# Patient Record
Sex: Female | Born: 1940 | Race: White | Hispanic: No | Marital: Married | Smoking: Never smoker
Health system: Southern US, Community
[De-identification: ages and names within clinical notes are randomized; demographics above are authoritative.]

## PROBLEM LIST (undated history)

## (undated) DIAGNOSIS — Z923 Personal history of irradiation: Secondary | ICD-10-CM

## (undated) DIAGNOSIS — C801 Malignant (primary) neoplasm, unspecified: Secondary | ICD-10-CM

## (undated) DIAGNOSIS — M810 Age-related osteoporosis without current pathological fracture: Secondary | ICD-10-CM

## (undated) DIAGNOSIS — K2270 Barrett's esophagus without dysplasia: Secondary | ICD-10-CM

## (undated) DIAGNOSIS — M1990 Unspecified osteoarthritis, unspecified site: Secondary | ICD-10-CM

## (undated) DIAGNOSIS — C50919 Malignant neoplasm of unspecified site of unspecified female breast: Secondary | ICD-10-CM

## (undated) DIAGNOSIS — Z9221 Personal history of antineoplastic chemotherapy: Secondary | ICD-10-CM

## (undated) DIAGNOSIS — I10 Essential (primary) hypertension: Secondary | ICD-10-CM

## (undated) DIAGNOSIS — E785 Hyperlipidemia, unspecified: Secondary | ICD-10-CM

## (undated) DIAGNOSIS — K219 Gastro-esophageal reflux disease without esophagitis: Secondary | ICD-10-CM

## (undated) HISTORY — PX: BREAST CYST ASPIRATION: SHX578

## (undated) HISTORY — DX: Malignant neoplasm of unspecified site of unspecified female breast: C50.919

## (undated) HISTORY — DX: Hyperlipidemia, unspecified: E78.5

## (undated) HISTORY — PX: BREAST BIOPSY: SHX20

## (undated) HISTORY — DX: Unspecified osteoarthritis, unspecified site: M19.90

---

## 2004-12-22 ENCOUNTER — Ambulatory Visit: Payer: Self-pay | Admitting: Internal Medicine

## 2005-12-26 ENCOUNTER — Ambulatory Visit: Payer: Self-pay | Admitting: Internal Medicine

## 2005-12-28 ENCOUNTER — Ambulatory Visit: Payer: Self-pay | Admitting: Internal Medicine

## 2007-02-07 ENCOUNTER — Ambulatory Visit: Payer: Self-pay | Admitting: Internal Medicine

## 2008-02-10 ENCOUNTER — Ambulatory Visit: Payer: Self-pay | Admitting: Internal Medicine

## 2008-08-17 ENCOUNTER — Ambulatory Visit: Payer: Self-pay | Admitting: Gastroenterology

## 2009-02-11 ENCOUNTER — Ambulatory Visit: Payer: Self-pay | Admitting: Internal Medicine

## 2009-02-17 ENCOUNTER — Ambulatory Visit: Payer: Self-pay | Admitting: Internal Medicine

## 2009-08-23 ENCOUNTER — Ambulatory Visit: Payer: Self-pay | Admitting: Internal Medicine

## 2009-08-25 ENCOUNTER — Ambulatory Visit: Payer: Self-pay | Admitting: Internal Medicine

## 2009-12-01 ENCOUNTER — Ambulatory Visit: Payer: Self-pay | Admitting: Unknown Physician Specialty

## 2010-02-14 ENCOUNTER — Ambulatory Visit: Payer: Self-pay | Admitting: Internal Medicine

## 2010-04-17 DIAGNOSIS — C541 Malignant neoplasm of endometrium: Secondary | ICD-10-CM

## 2010-04-17 DIAGNOSIS — C801 Malignant (primary) neoplasm, unspecified: Secondary | ICD-10-CM

## 2010-04-17 HISTORY — DX: Malignant (primary) neoplasm, unspecified: C80.1

## 2010-04-17 HISTORY — PX: ABDOMINAL HYSTERECTOMY: SHX81

## 2010-04-17 HISTORY — DX: Malignant neoplasm of endometrium: C54.1

## 2010-08-18 ENCOUNTER — Ambulatory Visit: Payer: Self-pay | Admitting: Internal Medicine

## 2011-01-24 ENCOUNTER — Ambulatory Visit: Payer: Self-pay | Admitting: Obstetrics and Gynecology

## 2011-01-27 ENCOUNTER — Ambulatory Visit: Payer: Self-pay | Admitting: Obstetrics and Gynecology

## 2011-02-07 ENCOUNTER — Ambulatory Visit: Payer: Self-pay | Admitting: Gynecologic Oncology

## 2011-02-16 ENCOUNTER — Ambulatory Visit: Payer: Self-pay | Admitting: Gynecologic Oncology

## 2011-02-16 ENCOUNTER — Ambulatory Visit: Payer: Self-pay | Admitting: Obstetrics and Gynecology

## 2011-02-28 ENCOUNTER — Inpatient Hospital Stay: Payer: Self-pay | Admitting: Obstetrics and Gynecology

## 2011-03-15 ENCOUNTER — Ambulatory Visit: Payer: Self-pay | Admitting: Internal Medicine

## 2011-03-18 ENCOUNTER — Ambulatory Visit: Payer: Self-pay | Admitting: Gynecologic Oncology

## 2011-03-28 ENCOUNTER — Ambulatory Visit: Payer: Self-pay | Admitting: Gynecologic Oncology

## 2011-04-18 ENCOUNTER — Ambulatory Visit: Payer: Self-pay | Admitting: Gynecologic Oncology

## 2011-05-23 ENCOUNTER — Ambulatory Visit: Payer: Self-pay | Admitting: Gynecologic Oncology

## 2011-06-16 ENCOUNTER — Ambulatory Visit: Payer: Self-pay | Admitting: Gynecologic Oncology

## 2011-07-17 ENCOUNTER — Ambulatory Visit: Payer: Self-pay | Admitting: Gynecologic Oncology

## 2011-08-16 ENCOUNTER — Ambulatory Visit: Payer: Self-pay | Admitting: Gynecologic Oncology

## 2011-08-29 ENCOUNTER — Ambulatory Visit: Payer: Self-pay | Admitting: Internal Medicine

## 2011-09-16 ENCOUNTER — Ambulatory Visit: Payer: Self-pay | Admitting: Gynecologic Oncology

## 2011-12-28 ENCOUNTER — Ambulatory Visit: Payer: Self-pay | Admitting: Radiation Oncology

## 2012-01-16 ENCOUNTER — Ambulatory Visit: Payer: Self-pay | Admitting: Radiation Oncology

## 2012-02-27 ENCOUNTER — Ambulatory Visit: Payer: Self-pay | Admitting: Gynecologic Oncology

## 2012-03-17 ENCOUNTER — Ambulatory Visit: Payer: Self-pay | Admitting: Gynecologic Oncology

## 2012-03-19 ENCOUNTER — Ambulatory Visit: Payer: Self-pay | Admitting: Internal Medicine

## 2012-07-07 ENCOUNTER — Emergency Department: Payer: Self-pay | Admitting: Unknown Physician Specialty

## 2012-07-08 LAB — CBC
HGB: 12.9 g/dL (ref 12.0–16.0)
MCH: 28.4 pg (ref 26.0–34.0)
MCHC: 32.4 g/dL (ref 32.0–36.0)
MCV: 88 fL (ref 80–100)
RBC: 4.53 10*6/uL (ref 3.80–5.20)
WBC: 7.2 10*3/uL (ref 3.6–11.0)

## 2012-07-08 LAB — COMPREHENSIVE METABOLIC PANEL
Anion Gap: 2 — ABNORMAL LOW (ref 7–16)
Bilirubin,Total: 0.4 mg/dL (ref 0.2–1.0)
Chloride: 108 mmol/L — ABNORMAL HIGH (ref 98–107)
Creatinine: 0.86 mg/dL (ref 0.60–1.30)
EGFR (African American): >60
EGFR (Non-African Amer.): >60
Glucose: 122 mg/dL — ABNORMAL HIGH (ref 65–99)
Osmolality: 287 (ref 275–301)
Potassium: 4.3 mmol/L (ref 3.5–5.1)
SGOT(AST): 32 U/L (ref 15–37)
SGPT (ALT): 24 U/L (ref 12–78)

## 2012-07-08 LAB — TROPONIN I: Troponin-I: <0.02 ng/mL

## 2013-03-24 ENCOUNTER — Ambulatory Visit: Payer: Self-pay | Admitting: Internal Medicine

## 2013-04-17 HISTORY — PX: COLONOSCOPY: SHX174

## 2013-11-15 DIAGNOSIS — E782 Mixed hyperlipidemia: Secondary | ICD-10-CM | POA: Insufficient documentation

## 2013-11-15 DIAGNOSIS — R739 Hyperglycemia, unspecified: Secondary | ICD-10-CM | POA: Insufficient documentation

## 2013-11-15 DIAGNOSIS — E669 Obesity, unspecified: Secondary | ICD-10-CM | POA: Insufficient documentation

## 2013-12-04 DIAGNOSIS — K219 Gastro-esophageal reflux disease without esophagitis: Secondary | ICD-10-CM | POA: Insufficient documentation

## 2013-12-29 ENCOUNTER — Ambulatory Visit: Payer: Self-pay | Admitting: Gastroenterology

## 2013-12-30 LAB — PATHOLOGY REPORT

## 2014-03-23 ENCOUNTER — Encounter: Payer: Self-pay | Admitting: Podiatry

## 2014-03-23 ENCOUNTER — Ambulatory Visit (INDEPENDENT_AMBULATORY_CARE_PROVIDER_SITE_OTHER): Payer: Medicare Other | Admitting: Podiatry

## 2014-03-23 VITALS — BP 154/83 | HR 70 | Resp 16 | Ht 66.0 in | Wt 185.0 lb

## 2014-03-23 DIAGNOSIS — Q828 Other specified congenital malformations of skin: Secondary | ICD-10-CM

## 2014-03-23 NOTE — Progress Notes (Signed)
   Subjective:    Patient ID: Courtney Herrera, female    DOB: September 17, 1940, 73 y.o.   MRN: [id number]  HPI Comments: i think i have a wart on my left foot. They come and go for 1 year. They are getting worse. It hurts to walk. Ive had it removed by dr Vickki Muff within the last year. i used to try to scrape the skin off.      Review of Systems  Cardiovascular: Positive for leg swelling.  All other systems reviewed and are negative.      Objective:   Physical Exam: I have reviewed her past medical history medications allergy surgery social history and review of systems. Ulcers are strongly palpable bilateral. Neurologic sensory is intact since listing monofilament. Deep tendon reflexes intact bilateral and muscle strength is 5 over 5 dorsiflexion plantar flexion inversion everters on physical musculatures intact. Orthopedic evaluation shows all joints distal to the ankle for range of motion without crepitation. Cutaneous evaluationdiffuse tyloma with porokeratosis to the plantar aspect of the left foot.        Assessment & Plan:  Assessment: Porokeratosis diffuse tyloma left foot. Possibly associated with hammertoe deformities.  Plan: Debrided all reactive hyperkeratosis bilateral. Consider orthotics next visit.

## 2014-03-26 ENCOUNTER — Ambulatory Visit: Payer: Self-pay | Admitting: Internal Medicine

## 2014-08-09 NOTE — Consult Note (Signed)
Reason for Visit: This 74 year old Female patient presents to the clinic for initial evaluation of  Endometrial carcinoma .   Referred by Dr. Sabra Heck.  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 Time back in September 2000 well. She underwent a D&C which showed FIGO grade 1 well-differentiated endometrioid adenocarcinoma. She was seen by Dr. Sabra Heck who performed a TAH/BSO with pelvic lymph node dissection. She had a 3 cm T1-1 lesion FIGO grade 2 of the endometrium. There was minimal myometrial invasion to at 18 mm. 13 pelvic lymph nodes were examined all negative. There was positive lymphovascular invasion. She has done well postoperatively. She seen today for routine followup by Dr. Sabra Heck I have asked to consult the patient for consideration of adjuvant radiation therapy. She is having no significant problems at this time. Bowel and bladder function are good. She is having no further bleeding.  Past Hx:    endometerial cancer:    chronic gastritis:    barrett's esophagus:    reflux esophagitis:    Osteoporosis:    colonic polyps, adenomatous:    Hyperglycemia:    fibrocystic breast disease:    Hypercholesterolemia:    Dilation and Curretage:    breast bx, left breast:    Tonsillectomy:   Past, Family and Social History:   Past Medical History positive    Cardiovascular hyperlipidemia; hypertension    Gastrointestinal GERD; Barrett's esophagus    Past Surgical History Colonic polyps, benign breast biopsy    Past Medical History Comments Osteoporosis     Family History positive    Family History Comments Brother with non-Hodgkin's lymphoma    Social History noncontributory    Additional Past Medical and Surgical History Seen by yourself today   Allergies:   Sulfa drugs: Other  Home Meds:  Home Medications: Medication Instructions Status  Niaspan ER 500 mg oral tablet, extended release tab(s) orally  7PM Active  Crestor 40 mg oral tablet 1 tab(s) orally once a day (at bedtime) Active  Tums E-X 750 mg oral tablet, chewable takes 2 daily AM Active  Protonix 40 mg oral delayed release tablet 1 tab(s) orally once a day in am Active  centrum silver 1 tab orally in AM Active   Review of Systems:   General negative    Performance Status (ECOG) 0    Skin negative    Breast negative    Ophthalmologic negative    ENMT negative    Respiratory and Thorax negative    Cardiovascular negative    Gastrointestinal negative    Genitourinary see HPI    Musculoskeletal negative    Neurological negative    Psychiatric negative    Hematology/Lymphatics negative    Endocrine negative    Allergic/Immunologic negative   Nursing Notes:  Nursing Vital Signs and Chemo Nursing Nursing Notes: *CC Vital Signs Flowsheet:   05-Feb-13 10:44   Temp Temperature 98.8   Pulse Pulse 76   Respirations Respirations 20   SBP SBP 158   DBP DBP 88   Pain Scale (0-10)  0   Current Weight (kg) (  kg) 79.6   Height (cm) centimeters 167.6   BSA (m2) 1.8   Physical Exam:  General/Skin/HEENT:   General normal    Skin normal    Eyes normal    ENMT normal    Head and Neck normal    Additional PE Well-developed well-nourished female in NAD. Lungs are clear to A&P cardiac examination shows regular rate and rhythm. On speculum examination vaginal vault is clear. Vaginal apex is healed well. Bimanual examination shows no evidence of vaginal mass. Parametrial is clear rectal exam is within normal limits.   Breasts/Resp/CV/GI/GU:    Respiratory and Thorax normal    Cardiovascular normal    Gastrointestinal normal    Genitourinary normal   MS/Neuro/Psych/Lymph:   Musculoskeletal normal    Neurological normal    Lymphatics normal   Assessment and Plan:  Impression:   74 year old female with stage IA FIGO grade 2 endometrial carcinoma status post TAH/BSO and pelvic lymph node dissection with lymph vascular invasion.  Plan:   it is unusual for patients have a lymphovascular invasion switch with such an early stage disease. He noted a FIGO grade 2 with lymphovascular invasion believe she would benefit from brachii therapy to her vaginal cuff. I would plan on delivering 3810 cGy in 5 applications at 4.7 gray per application using high-dose rate remote afterloading to her vaginal apex. Risks and benefits of treatment were reviewed with the patient. Possibility of some diarrhea and dysuria was also discussed as well as some level of fatigue. Patient seems to comprehend my treatment plan well. I have set her up for simulation in about one week's time for treatment planning and BrachyVision treatment planning. Have discussed the case personally with Dr. Sabra Heck.  I would like to take this opportunity to thank you for allowing me to continue to participate in this patient's care.  CC Referral:   cc: Dr. Frazier Richards   Electronic Signatures: Baruch Gouty, Roda Shutters (MD)  (Signed [phone] 15:25)  Authored: HPI, Diagnosis, Past Hx, PFSH, Allergies, Home Meds, ROS, Nursing Notes, Physical Exam, Encounter Assessment and Plan, CC Referring Physician   Last Updated: 05-Feb-13 15:25 by Armstead Peaks (MD)

## 2014-12-08 DIAGNOSIS — Z0000 Encounter for general adult medical examination without abnormal findings: Secondary | ICD-10-CM | POA: Insufficient documentation

## 2014-12-14 ENCOUNTER — Other Ambulatory Visit: Payer: Self-pay | Admitting: Internal Medicine

## 2014-12-14 DIAGNOSIS — Z1231 Encounter for screening mammogram for malignant neoplasm of breast: Secondary | ICD-10-CM

## 2015-03-29 ENCOUNTER — Ambulatory Visit
Admission: RE | Admit: 2015-03-29 | Discharge: 2015-03-29 | Disposition: A | Discharge status: Home / Self Care | Payer: Commercial Managed Care - HMO | Source: Ambulatory Visit | Attending: Internal Medicine | Admitting: Internal Medicine

## 2015-03-29 ENCOUNTER — Other Ambulatory Visit: Payer: Self-pay | Admitting: Internal Medicine

## 2015-03-29 DIAGNOSIS — Z1231 Encounter for screening mammogram for malignant neoplasm of breast: Secondary | ICD-10-CM

## 2015-03-29 HISTORY — DX: Malignant (primary) neoplasm, unspecified: C80.1

## 2015-03-30 ENCOUNTER — Other Ambulatory Visit: Payer: Self-pay | Admitting: Internal Medicine

## 2015-03-30 DIAGNOSIS — R928 Other abnormal and inconclusive findings on diagnostic imaging of breast: Secondary | ICD-10-CM

## 2015-03-31 ENCOUNTER — Other Ambulatory Visit: Payer: Self-pay | Admitting: Internal Medicine

## 2015-03-31 DIAGNOSIS — N6489 Other specified disorders of breast: Secondary | ICD-10-CM

## 2015-03-31 DIAGNOSIS — R928 Other abnormal and inconclusive findings on diagnostic imaging of breast: Secondary | ICD-10-CM

## 2015-04-02 ENCOUNTER — Ambulatory Visit
Admission: RE | Admit: 2015-04-02 | Discharge: 2015-04-02 | Disposition: A | Discharge status: Home / Self Care | Payer: Commercial Managed Care - HMO | Source: Ambulatory Visit | Attending: Internal Medicine | Admitting: Internal Medicine

## 2015-04-02 DIAGNOSIS — Z8542 Personal history of malignant neoplasm of other parts of uterus: Secondary | ICD-10-CM | POA: Insufficient documentation

## 2015-04-02 DIAGNOSIS — Z803 Family history of malignant neoplasm of breast: Secondary | ICD-10-CM | POA: Diagnosis not present

## 2015-04-02 DIAGNOSIS — R928 Other abnormal and inconclusive findings on diagnostic imaging of breast: Secondary | ICD-10-CM

## 2015-04-02 DIAGNOSIS — N6489 Other specified disorders of breast: Secondary | ICD-10-CM

## 2015-12-13 DIAGNOSIS — Z8542 Personal history of malignant neoplasm of other parts of uterus: Secondary | ICD-10-CM | POA: Insufficient documentation

## 2016-01-31 ENCOUNTER — Other Ambulatory Visit: Payer: Self-pay | Admitting: Internal Medicine

## 2016-01-31 DIAGNOSIS — Z1231 Encounter for screening mammogram for malignant neoplasm of breast: Secondary | ICD-10-CM

## 2016-03-29 ENCOUNTER — Ambulatory Visit: Payer: Commercial Managed Care - HMO

## 2016-04-11 ENCOUNTER — Ambulatory Visit
Admission: RE | Admit: 2016-04-11 | Discharge: 2016-04-11 | Disposition: A | Discharge status: Home / Self Care | Payer: Commercial Managed Care - HMO | Source: Ambulatory Visit | Attending: Internal Medicine | Admitting: Internal Medicine

## 2016-04-11 DIAGNOSIS — Z1231 Encounter for screening mammogram for malignant neoplasm of breast: Secondary | ICD-10-CM | POA: Diagnosis present

## 2016-04-14 ENCOUNTER — Other Ambulatory Visit: Payer: Self-pay | Admitting: Internal Medicine

## 2016-04-14 DIAGNOSIS — R921 Mammographic calcification found on diagnostic imaging of breast: Secondary | ICD-10-CM

## 2016-04-14 DIAGNOSIS — R928 Other abnormal and inconclusive findings on diagnostic imaging of breast: Secondary | ICD-10-CM

## 2016-04-14 DIAGNOSIS — N6310 Unspecified lump in the right breast, unspecified quadrant: Secondary | ICD-10-CM

## 2016-04-17 DIAGNOSIS — Z923 Personal history of irradiation: Secondary | ICD-10-CM

## 2016-04-17 HISTORY — DX: Personal history of irradiation: Z92.3

## 2016-04-17 HISTORY — PX: BREAST LUMPECTOMY: SHX2

## 2016-04-24 ENCOUNTER — Ambulatory Visit
Admission: RE | Admit: 2016-04-24 | Discharge: 2016-04-24 | Disposition: A | Discharge status: Home / Self Care | Payer: Medicare HMO | Source: Ambulatory Visit | Attending: Internal Medicine | Admitting: Internal Medicine

## 2016-04-24 DIAGNOSIS — N6310 Unspecified lump in the right breast, unspecified quadrant: Secondary | ICD-10-CM | POA: Insufficient documentation

## 2016-04-24 DIAGNOSIS — R921 Mammographic calcification found on diagnostic imaging of breast: Secondary | ICD-10-CM | POA: Diagnosis present

## 2016-04-24 DIAGNOSIS — R928 Other abnormal and inconclusive findings on diagnostic imaging of breast: Secondary | ICD-10-CM

## 2016-04-26 ENCOUNTER — Other Ambulatory Visit: Payer: Self-pay | Admitting: Internal Medicine

## 2016-04-26 DIAGNOSIS — R928 Other abnormal and inconclusive findings on diagnostic imaging of breast: Secondary | ICD-10-CM

## 2016-04-26 DIAGNOSIS — N6310 Unspecified lump in the right breast, unspecified quadrant: Secondary | ICD-10-CM

## 2016-05-04 ENCOUNTER — Ambulatory Visit: Payer: Commercial Managed Care - HMO

## 2016-05-11 ENCOUNTER — Encounter: Payer: Self-pay | Admitting: Oncology

## 2016-05-11 ENCOUNTER — Ambulatory Visit
Admission: RE | Admit: 2016-05-11 | Discharge: 2016-05-11 | Disposition: A | Discharge status: Home / Self Care | Payer: Medicare HMO | Source: Ambulatory Visit | Attending: Internal Medicine | Admitting: Internal Medicine

## 2016-05-11 DIAGNOSIS — C50919 Malignant neoplasm of unspecified site of unspecified female breast: Secondary | ICD-10-CM

## 2016-05-11 DIAGNOSIS — R928 Other abnormal and inconclusive findings on diagnostic imaging of breast: Secondary | ICD-10-CM | POA: Insufficient documentation

## 2016-05-11 DIAGNOSIS — N6310 Unspecified lump in the right breast, unspecified quadrant: Secondary | ICD-10-CM

## 2016-05-11 DIAGNOSIS — C50911 Malignant neoplasm of unspecified site of right female breast: Secondary | ICD-10-CM | POA: Diagnosis not present

## 2016-05-11 HISTORY — PX: BREAST BIOPSY: SHX20

## 2016-05-11 HISTORY — DX: Malignant neoplasm of unspecified site of unspecified female breast: C50.919

## 2016-05-16 ENCOUNTER — Encounter: Payer: Self-pay | Admitting: *Deleted

## 2016-05-16 NOTE — Progress Notes (Signed)
  Oncology Nurse Navigator Documentation  Navigator Location: [facility] (05/16/16 1600) Referral date to RadOnc/MedOnc: 05/16/16 (05/16/16 1600) )Navigator Encounter Type: Introductory phone call (05/16/16 1600)   Abnormal Finding Date: 04/24/16 (05/16/16 1600) Confirmed Diagnosis Date: 05/12/16 (05/16/16 1600)    Barriers/Navigation Needs: Education;Coordination of Care (05/16/16 1600) Education: Coping with Diagnosis/ Prognosis (05/16/16 1600) Interventions: Coordination of Care (05/16/16 1600)    Acuity: Level 2 (05/16/16 1600)   Acuity Level 2: Initial guidance, education and coordination as needed (05/16/16 1600)     Called patient to introduce to navigation services.  Patient newly diagnosed with right invasive breast cancer.  Patient has a personal history of uterine cancer in 2012.  She has requested to see Dr. Bary Castilla for surgical consult.  Patient has been scheduled to see Dr. Bary Castilla on 05/18/16 @ 4:30 and Dr. Hedwig Morton on 05/18/16 @ 8:30.  She is to bring a photo ID and all her meds to her appointments.  Will give patient her educational literature during her initial consult with Dr. Hedwig Morton.  She is to call with any questions or needs.   Time Spent with Patient: 60 (05/16/16 1600)

## 2016-05-17 ENCOUNTER — Other Ambulatory Visit: Payer: Self-pay | Admitting: Internal Medicine

## 2016-05-17 ENCOUNTER — Encounter: Payer: Self-pay | Admitting: *Deleted

## 2016-05-17 DIAGNOSIS — C50411 Malignant neoplasm of upper-outer quadrant of right female breast: Secondary | ICD-10-CM | POA: Insufficient documentation

## 2016-05-17 DIAGNOSIS — R921 Mammographic calcification found on diagnostic imaging of breast: Secondary | ICD-10-CM

## 2016-05-17 DIAGNOSIS — R928 Other abnormal and inconclusive findings on diagnostic imaging of breast: Secondary | ICD-10-CM

## 2016-05-17 NOTE — Progress Notes (Signed)
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  INTERVAL HISTORY: Patient is a 76 year old female who was noted to have an abnormality on routine screening mammogram. Subsequent ultrasound biopsy revealed the above stated breast cancer. Currently, she is anxious but otherwise feels well. She has no neurologic complaints. She denies any recent fevers or illnesses. She has a good appetite and denies weight loss. She denies any pain. She has no chest pain or shortness of breath. She denies any nausea, vomiting, constipation, or diarrhea. She has no urinary complaints. Patient otherwise feels well and offers no further specific complaints today.  REVIEW OF SYSTEMS:   Review of Systems  Constitutional: Negative.  Negative for fever, malaise/fatigue and weight loss.  Respiratory: Negative.  Negative for cough and shortness of breath.   Cardiovascular: Negative.  Negative for chest pain and leg swelling.  Gastrointestinal: Negative.  Negative for abdominal pain.  Genitourinary: Negative.   Musculoskeletal: Negative.   Neurological: Negative.  Negative for weakness.  Psychiatric/Behavioral: Negative.  The patient is not nervous/anxious.     As per HPI. Otherwise, a complete review of systems is negative.  PAST MEDICAL HISTORY: Past Medical History:  Diagnosis Date  . Cancer [facility]) 2012   uterine- radiation  . Hyperlipidemia     PAST SURGICAL HISTORY: Past Surgical History:  Procedure Laterality Date  . ABDOMINAL HYSTERECTOMY    . BREAST BIOPSY Left    neg  . BREAST BIOPSY Right 05/11/2016   INVASIVE MAMMARY CARCINOMA  . BREAST CYST ASPIRATION Left    neg     FAMILY HISTORY: Family History  Problem Relation Age of Onset  . Breast cancer Sister 14  . Cancer Brother     brain  . Cancer Sister   . Non-Hodgkin's lymphoma Brother     ADVANCED DIRECTIVES (Y/N):  N  HEALTH MAINTENANCE: Social History  Substance Use Topics  . Smoking status: Never Smoker  . Smokeless tobacco: Never Used  . Alcohol use No     Colonoscopy:  PAP:  Bone density:  Lipid panel:  Allergies  Allergen Reactions  . Sulphadimidine [Sulfamethazine] Swelling  . Petrolatum-Zinc Oxide Swelling    Current Outpatient Prescriptions  Medication Sig Dispense Refill  . aspirin 81 MG chewable tablet Chew by mouth.    Marland Kitchen atorvastatin (LIPITOR) 80 MG tablet Take by mouth.    . furosemide (LASIX) 20 MG tablet Take by mouth as needed.     . pantoprazole (PROTONIX) 40 MG tablet Take by mouth.    . niacin (NIASPAN) 500 MG CR tablet Take 1 mg by mouth 1 day or 1 dose.      No current facility-administered medications for this visit.     OBJECTIVE: Vitals:   05/18/16 0831  BP: (!) 159/83  Pulse: 77  Temp: 97.5 F (36.4 C)     Body mass index is 27.65 kg/m.    ECOG FS:0 - Asymptomatic  General: Well-developed, well-nourished, no acute distress. Eyes: Pink conjunctiva, anicteric sclera. HEENT: Normocephalic, moist mucous membranes, clear oropharnyx. Breasts: Patient requested exam be deferred today. Lungs: Clear to auscultation bilaterally. Heart: Regular rate and rhythm. No rubs, murmurs, or gallops. Abdomen: Soft, nontender, nondistended. No organomegaly noted, normoactive bowel sounds. Musculoskeletal:  No edema, cyanosis, or clubbing. Neuro: Alert, answering all questions appropriately. Cranial nerves grossly intact. Skin: No rashes or petechiae noted. Psych: Normal affect. Lymphatics: No cervical, calvicular, axillary or inguinal LAD.   LAB RESULTS:  Lab Results  Component Value Date   NA 142 07/08/2012   K 4.3 07/08/2012   CL 108 (H) 07/08/2012    CO2 32 07/08/2012   GLUCOSE 122 (H) 07/08/2012   BUN 20 (H) 07/08/2012   CREATININE 0.86 07/08/2012   CALCIUM 9.2 07/08/2012   PROT 7.8 07/08/2012   ALBUMIN 4.2 07/08/2012   AST 32 07/08/2012   ALT 24 07/08/2012   ALKPHOS 70 07/08/2012   BILITOT 0.4 07/08/2012   GFRNONAA >60 07/08/2012   GFRAA >60 07/08/2012    Lab Results  Component Value Date   WBC 7.2 07/08/2012   HGB 12.9 07/08/2012   HCT 39.6 07/08/2012   MCV 88 07/08/2012   PLT 166 07/08/2012     STUDIES: US Breast Complete Uni Right Inc Axilla  Result Date: 05/18/2016 Ultrasound examination of the right breast was undertaken to determine if the biopsy site was visible. At the 8-8:30 o'clock position, 6 cm from the nipple an ill-defined density measuring 0.74 x .17 x 1.3 forceps identified. Just slightly inferior and lateral to this the biopsy clip is identified.BIRAD-6.   US Breast Ltd Uni Right Inc Axilla  Result Date: 04/24/2016 CLINICAL DATA:  76 year old female for further evaluation of possible right breast mass and bilateral breast calcifications identified on screening mammogram. EXAM: 2D DIGITAL DIAGNOSTIC BILATERAL MAMMOGRAM WITH ADJUNCT TOMO ULTRASOUND RIGHT BREAST COMPARISON:  Previous exam(s). ACR Breast Density Category b: There are scattered areas of fibroglandular density. FINDINGS: 2D and 3D spot compression views of the right breast and magnification views of both breasts are performed. A 6 mm loose group of round calcifications within the upper outer right breast are identified without associated mass. A circumscribed oval mass within the outer right breast is new since 03/29/2015. A 5 mm group of primarily round calcifications within the outer left breast are identified. Targeted ultrasound is performed, showing a 1 x 0.7 x 1.1 cm partially circumscribed oval mass at the 9 o'clock position of the right breast 5 cm from the nipple, corresponding to the mammographic finding. No abnormal right axillary lymph  nodes are identified IMPRESSION: New indeterminate 1.1 cm mass in the outer right breast -tissue sampling is recommended in this postmenopausal patient. No abnormal right axillary lymph nodes. Likely benign calcifications within both outer breast. Six-month followup is recommended unless above mass represents atypia or malignancy. RECOMMENDATION: Ultrasound-guided biopsy of outer right breast mass. Bilateral diagnostic mammograms with magnification views for evaluation of calcifications in both outer breasts. Tissue sampling may be warranted if the outer right breast mass represents atypia or malignancy. I have discussed the findings and recommendations with the patient. Results were also provided in writing at the conclusion of the visit. If applicable, a reminder letter will be sent to the patient regarding the next appointment. BI-RADS CATEGORY  4: Suspicious. Electronically Signed   By: Margarette Canada M.D.   On: 04/24/2016 11:56   Mm Diag Breast Tomo Bilateral  Result Date: 04/24/2016 CLINICAL DATA:  76 year old female for further evaluation of possible right breast mass and bilateral breast calcifications identified on screening mammogram. EXAM: 2D DIGITAL DIAGNOSTIC BILATERAL MAMMOGRAM WITH ADJUNCT TOMO ULTRASOUND RIGHT BREAST COMPARISON:  Previous exam(s). ACR Breast Density Category b: There are scattered areas of fibroglandular density. FINDINGS: 2D and 3D spot compression  views of the right breast and magnification views of both breasts are performed. A 6 mm loose group of round calcifications within the upper outer right breast are identified without associated mass. A circumscribed oval mass within the outer right breast is new since 03/29/2015. A 5 mm group of primarily round calcifications within the outer left breast are identified. Targeted ultrasound is performed, showing a 1 x 0.7 x 1.1 cm partially circumscribed oval mass at the 9 o'clock position of the right breast 5 cm from the nipple,  corresponding to the mammographic finding. No abnormal right axillary lymph nodes are identified IMPRESSION: New indeterminate 1.1 cm mass in the outer right breast -tissue sampling is recommended in this postmenopausal patient. No abnormal right axillary lymph nodes. Likely benign calcifications within both outer breast. Six-month followup is recommended unless above mass represents atypia or malignancy. RECOMMENDATION: Ultrasound-guided biopsy of outer right breast mass. Bilateral diagnostic mammograms with magnification views for evaluation of calcifications in both outer breasts. Tissue sampling may be warranted if the outer right breast mass represents atypia or malignancy. I have discussed the findings and recommendations with the patient. Results were also provided in writing at the conclusion of the visit. If applicable, a reminder letter will be sent to the patient regarding the next appointment. BI-RADS CATEGORY  4: Suspicious. Electronically Signed   By: Margarette Canada M.D.   On: 04/24/2016 11:56   Mm Clip Placement Right  Result Date: 05/11/2016 CLINICAL DATA:  Status post ultrasound-guided right breast biopsy EXAM: DIAGNOSTIC RIGHT MAMMOGRAM POST ULTRASOUND BIOPSY COMPARISON:  Previous exam(s). FINDINGS: Mammographic images were obtained following ultrasound guided biopsy of an indeterminate right breast mass at 9 o'clock, 5 cm from the nipple. Post biopsy mammogram demonstrates the [facility] shape 4 butterfly marker in the expected location within the right breast mass at o'clock. IMPRESSION: Appropriate marker position as above. Final Assessment: Post Procedure Mammograms for Marker Placement Electronically Signed   By: Pamelia Hoit M.D.   On: 05/11/2016 11:42   [place] Rt Breast Bx W Loc Dev 1st Lesion Img Bx Spec US Guide  Addendum Date: 05/17/2016   ADDENDUM REPORT: 05/17/2016 08:39 ADDENDUM: Pathology of the right breast biopsy revealed RIGHT BREAST, 9:00, 5 CMFN; ULTRASOUND-GUIDED BIOPSY: INVASIVE  MAMMARY CARCINOMA, WITH APOCRINE FEATURES. Histologic grade of invasive carcinoma: Grade 2 Comment: The diagnosis was called to Andrews at the [facility] 05/12/16 at 3:45 PM. Read-back was performed. This was found to be concordant with Dr. Raul Del impression and notes. Recommendation: Surgical and oncology referrals. There are also bilateral breast calcifications for which stereotactic guided biopsies are recommended. At the patient's request, pathology and recommendations were relayed to the patient by phone by Dr. Theda Sers on 05/15/16. The patient stated she did well following the biopsy. Post biopsy instructions were reviewed with the patient. All of her questions were answered. She stated she has no surgical preference. She was informed she would be contacted by the nurse navigators for referral information. Dr. Tonette Bihari office will be contacted about recommendation of additional biopsies. An appointment will be made for the biopsies and the patient will be contacted. Recommendations for additional biopsies were relayed to Amy, Dr. Tonette Bihari nurse on 05/15/16 by Jetta Lout, [place]. The request will be sent to Dr. Tonette Bihari office by staff from [facility] and the appointment will be scheduled. Amy requested that the nurse navigators make the surgical and oncology appointments while the other biopsies are being scheduled. Appointments were made with Dr. Hedwig Morton, oncology for 05/18/16 at  8:30 AM and Dr. Bary Castilla, surgeon for 05/18/16 at 4:30 PM by Tanya Nones, RN, nurse navigator for [facility]. The patient has been notified of the appointments. Addendum by Jetta Lout, RRA on 05/17/16. Electronically Signed   By: Pamelia Hoit M.D.   On: 05/17/2016 08:39   Result Date: 05/17/2016 CLINICAL DATA:  76 year old female for ultrasound-guided biopsy of an indeterminate right breast mass EXAM: ULTRASOUND GUIDED RIGHT BREAST CORE NEEDLE BIOPSY COMPARISON:  Previous exam(s). FINDINGS: I met  with the patient and we discussed the procedure of ultrasound-guided biopsy, including benefits and alternatives. We discussed the high likelihood of a successful procedure. We discussed the risks of the procedure, including infection, bleeding, tissue injury, clip migration, and inadequate sampling. Informed written consent was given. The usual time-out protocol was performed immediately prior to the procedure. Using sterile technique and 1% Lidocaine as local anesthetic, under direct ultrasound visualization, a 12 gauge spring-loaded device was used to perform biopsy of an indeterminate right breast mass at 9 o'clock, 5 cm from the nipple using a lateral to medial approach. At the conclusion of the procedure a HydroMARK shape 4 tissue marker clip was deployed into the biopsy cavity. Follow up 2 view mammogram was performed and dictated separately. IMPRESSION: Ultrasound guided biopsy of an indeterminate right breast mass at 9 o'clock, 5 cm from the nipple. No apparent complications. Electronically Signed: By: Pamelia Hoit M.D. 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  PLAN:    1. 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 Patient has an appointment with surgery later today to discuss possible lumpectomy or mastectomy. It is unclear whether patient will require adjuvant chemotherapy at this time. HER-2 is equivocal and we are awaiting FISH results. If this is negative, send surgical sample for Oncotype DX. Patient may or may not require XRT depending on if she has a lumpectomy or not. Finally, patient will benefit from an aromatase inhibitor for 5 years given the ER positivity of her tumor. Return to clinic one to 2 weeks after her surgery to discuss  the final pathology results and additional treatment planning.  Approximately 45 minutes was spent in discussion of which greater than 50% was consultation.  Patient expressed understanding and was in agreement with this plan. She also understands that She can call clinic at any time with any questions, concerns, or complaints.   Cancer Staging Primary cancer of upper outer quadrant of right female breast [facility]) Staging form: Breast, AJCC 8th Edition - Clinical stage from 05/17/2016: Stage IA (cT1c, cN0, cM0, G2, ER: Positive, PR: Negative, HER2: Equivocal) - Signed by Lloyd Huger, MD on 05/17/2016   Lloyd Huger, MD   05/21/2016 8:48 AM

## 2016-05-18 ENCOUNTER — Ambulatory Visit (INDEPENDENT_AMBULATORY_CARE_PROVIDER_SITE_OTHER): Payer: Medicare HMO | Admitting: General Surgery

## 2016-05-18 ENCOUNTER — Encounter: Payer: Self-pay | Admitting: Oncology

## 2016-05-18 ENCOUNTER — Encounter: Payer: Self-pay | Admitting: *Deleted

## 2016-05-18 ENCOUNTER — Inpatient Hospital Stay: Payer: Medicare HMO | Attending: Oncology | Admitting: Oncology

## 2016-05-18 ENCOUNTER — Inpatient Hospital Stay: Payer: Self-pay

## 2016-05-18 ENCOUNTER — Encounter: Payer: Self-pay | Admitting: General Surgery

## 2016-05-18 VITALS — BP 146/86 | HR 80 | Resp 12 | Ht 67.0 in | Wt 170.0 lb

## 2016-05-18 DIAGNOSIS — Z79899 Other long term (current) drug therapy: Secondary | ICD-10-CM | POA: Diagnosis not present

## 2016-05-18 DIAGNOSIS — Z807 Family history of other malignant neoplasms of lymphoid, hematopoietic and related tissues: Secondary | ICD-10-CM | POA: Diagnosis not present

## 2016-05-18 DIAGNOSIS — Z803 Family history of malignant neoplasm of breast: Secondary | ICD-10-CM | POA: Diagnosis not present

## 2016-05-18 DIAGNOSIS — C50911 Malignant neoplasm of unspecified site of right female breast: Secondary | ICD-10-CM

## 2016-05-18 DIAGNOSIS — Z170 Estrogen receptor positive status [ER+]: Secondary | ICD-10-CM | POA: Diagnosis not present

## 2016-05-18 DIAGNOSIS — K219 Gastro-esophageal reflux disease without esophagitis: Secondary | ICD-10-CM | POA: Diagnosis not present

## 2016-05-18 DIAGNOSIS — R921 Mammographic calcification found on diagnostic imaging of breast: Secondary | ICD-10-CM | POA: Diagnosis not present

## 2016-05-18 DIAGNOSIS — F419 Anxiety disorder, unspecified: Secondary | ICD-10-CM | POA: Insufficient documentation

## 2016-05-18 DIAGNOSIS — C50411 Malignant neoplasm of upper-outer quadrant of right female breast: Secondary | ICD-10-CM

## 2016-05-18 DIAGNOSIS — Z7982 Long term (current) use of aspirin: Secondary | ICD-10-CM | POA: Insufficient documentation

## 2016-05-18 DIAGNOSIS — E785 Hyperlipidemia, unspecified: Secondary | ICD-10-CM | POA: Insufficient documentation

## 2016-05-18 NOTE — Progress Notes (Signed)
Patient here for initial visit. No complaints of clinical issues today. 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

## 2016-05-18 NOTE — Patient Instructions (Signed)
The patient is aware to call back for any questions or concerns.  

## 2016-05-18 NOTE — Progress Notes (Signed)
Patient ID: Courtney Herrera, female   DOB: 09-21-1940, 76 y.o.   MRN: [id number]  Chief Complaint  Patient presents with  . Other    HPI Courtney Herrera is a 76 y.o. female who presents for a breast evaluation. The most recent mammogram was done on 04/11/2016 added views on 04/24/2016 biopsy done on 05/11/2016.  Patient does perform regular self breast checks and gets regular mammograms done. She has been called back for added views many times over the years. She also states she has a spot on the left breast that they want to biopsy. She did see Dr Grayland Ormond this morning. She is here with her husband Courtney Herrera of 74 years.  HPI  Past Medical History:  Diagnosis Date  . 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 Hyperlipidemia     Past Surgical History:  Procedure Laterality Date  . ABDOMINAL HYSTERECTOMY    . BREAST BIOPSY Left    neg  . BREAST BIOPSY Right 05/11/2016   INVASIVE MAMMARY CARCINOMA  . BREAST CYST ASPIRATION Left    neg    Family History  Problem Relation Age of Onset  . Breast cancer Sister 51  . Cancer Brother     brain  . Cancer Sister   . Non-Hodgkin's lymphoma Brother     Social History Social History  Substance Use Topics  . Smoking status: Never Smoker  . Smokeless tobacco: Never Used  . Alcohol use No    Allergies  Allergen Reactions  . Sulphadimidine [Sulfamethazine] Swelling  . Petrolatum-Zinc Oxide Swelling    Current Outpatient Prescriptions  Medication Sig Dispense Refill  . aspirin 81 MG chewable tablet Chew by mouth.    Marland Kitchen atorvastatin (LIPITOR) 80 MG tablet Take by mouth.    . furosemide (LASIX) 20 MG tablet Take by mouth as needed.     . pantoprazole (PROTONIX) 40 MG tablet Take by mouth.    . niacin (NIASPAN) 500 MG CR tablet Take 1 mg by mouth 1 day or 1 dose.      No current facility-administered medications for this visit.     Review of Systems Review of Systems  Constitutional: Negative.   Respiratory: Negative.    Cardiovascular: Negative.     Blood pressure (!) 146/86, pulse 80, resp. rate 12, height '5\' 7"'$  (1.702 m), weight 170 lb (77.1 kg).  Physical Exam Physical Exam  Constitutional: She is oriented to person, place, and time. She appears well-developed and well-nourished.  HENT:  Mouth/Throat: Oropharynx is clear and moist.  Eyes: Conjunctivae are normal. No scleral icterus.  Neck: Neck supple.  Cardiovascular: Normal rate, regular rhythm and normal heart sounds.   Pulmonary/Chest: Effort normal and breath sounds normal. Right breast exhibits no inverted nipple, no mass, no nipple discharge, no skin change and no tenderness. Left breast exhibits no inverted nipple, no mass, no nipple discharge, no skin change and no tenderness.    Right breast > left breast 1/2 cup size. Ecchymosis right breast at biopsy site  Lymphadenopathy:    She has no cervical adenopathy.    She has no axillary adenopathy.  Neurological: She is alert and oriented to person, place, and time.  Skin: Skin is warm and dry.  Psychiatric: Her behavior is normal.    Data Reviewed 2015-2018 mammograms reviewed.  04/11/2016 screening mammograms showed a new dominant mass in the lateral right breast. Also identified were areas of microcalcifications in the upper-outer quadrant of the right breast as well as the  left breast. These were feltof low suspicion and wanted a 6 month follow-up unless malignancy was identified. BI-RADS-4.  The patient underwent ultrasound-guided biopsy of the right breast mass on January 25. 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 Fish pending.  At present the patient is scheduled feral stereotactic biopsies the breast on 05/30/2016.  Ultrasound examination of the right breast was undertaken to determine if the biopsy site was visible. At the 8-8:30 o'clock position, 6 cm from the nipple an ill-defined density measuring 0.74 x .17 x 1.3 forceps  identified. Just slightly inferior and lateral to this the biopsy clip is identified.BIRAD-6.       Assessment    Stage I carcinoma the right breast.  Bilateral microcalcification.    Plan    At a minimum, the right breast microcalcification should be biopsied prior to surgical intervention as it would change the approach although not options for management.  We spent about an hour reviewing options including mastectomy and breast conservation, and these were presented as therapeutically equivalent. At this time, there is no indication that even with her HER-2 positive lesion that she would be a candidate for Neoadjuvant chemotherapy.sees maximum diameter on prebiopsy ultrasound was 1.1 cm).  The patient is strongly leaning towards breast conservation.  Options for second surgical opinion were reviewed.  We'll contact the [facility] and determine if we can get at least the right stereotactic biopsy moved up, or if time could be available so that I could complete the study. With her present dated February 13 and my planned trip to [facility] on February 21, we would have a very tight window to schedule surgical intervention if we cannot move the date for her biopsy.  With the left breast less suspicious, this could be postponed until after management of her right breast cancer, although ideally if contralateral DCIS was evident this could be removed at the same time.  The patient will be contacted after the mammography Center is queried regarding scheduling.      This information has been scribed by Karie Fetch RN, BSN,BC. Marland Kitchen  Robert Bellow 05/18/2016, 7:44 PM

## 2016-05-18 NOTE — Progress Notes (Signed)
  Oncology Nurse Navigator Documentation  Navigator Location: [facility]-Med Onc (05/18/16 1000) Referral date to RadOnc/MedOnc: 05/18/16 (05/18/16 1000) )Navigator Encounter Type: Initial MedOnc (05/18/16 1000)     Patient Visit Type: MedOnc (05/18/16 1000) Treatment Phase: Pre-Tx/Tx Discussion (05/18/16 1000) Barriers/Navigation Needs: Education (05/18/16 1000) Education: Coping with Diagnosis/ Prognosis;Newly Diagnosed Cancer Education (05/18/16 1000)   Met with patient today during her initial medical oncology consult with Dr. Grayland Ormond.  Gave patient breast cancer educational literature, "My Breast Cancer Treatment Handbook" by Josephine Igo, RN.  Patient has appointment with Dr. Bary Castilla this afternoon for her surgical consult.  She is to call me with her surgery date so I can schedule her follow-up visit with Dr. Grayland Ormond.  She is agreeable.    Acuity: Level 2 (05/18/16 1000)   Acuity Level 2: Educational needs (05/18/16 1000)     Time Spent with Patient: 75 (05/18/16 1000)

## 2016-05-22 ENCOUNTER — Other Ambulatory Visit: Payer: Self-pay | Admitting: *Deleted

## 2016-05-22 ENCOUNTER — Telehealth: Payer: Self-pay | Admitting: *Deleted

## 2016-05-22 DIAGNOSIS — Z170 Estrogen receptor positive status [ER+]: Principal | ICD-10-CM

## 2016-05-22 DIAGNOSIS — C50911 Malignant neoplasm of unspecified site of right female breast: Secondary | ICD-10-CM

## 2016-05-22 NOTE — Telephone Encounter (Signed)
Patient was contacted today and states she is aware that stereo biopsy was moved from 05-30-16 to 05-26-16 at [facility]. She is to arrive at 11:30 am.   This patient does want to proceed with surgery through our office.   We will arrange this for 06-01-16 at [facility]. As this is the best possible date before Dr. Dwyane Luo trip later this month. Patient is agreeable to plan.   She will be contacted once surgery is posted to review surgery time and instructions.

## 2016-05-22 NOTE — Telephone Encounter (Signed)
Surgery scheduled for 06-01-16 at [facility].   Instructions were reviewed by phone with the patient today.   Patient may continue her 81 mg aspirin once daily.  She was instructed to call the office should she have further questions.

## 2016-05-23 ENCOUNTER — Telehealth: Payer: Self-pay

## 2016-05-23 LAB — SURGICAL PATHOLOGY

## 2016-05-23 NOTE — Telephone Encounter (Signed)
-----   Message from Robert Bellow, MD sent at 05/23/2016  9:02 AM EST ----- I need to see patient Monday next week prior to Wednesday surgery.

## 2016-05-24 ENCOUNTER — Other Ambulatory Visit: Payer: Self-pay | Admitting: General Surgery

## 2016-05-25 ENCOUNTER — Encounter
Admission: RE | Admit: 2016-05-25 | Discharge: 2016-05-25 | Disposition: A | Discharge status: Home / Self Care | Payer: Medicare HMO | Source: Ambulatory Visit | Attending: General Surgery | Admitting: General Surgery

## 2016-05-25 HISTORY — DX: Gastro-esophageal reflux disease without esophagitis: K21.9

## 2016-05-25 NOTE — Patient Instructions (Addendum)
  Your procedure is scheduled on: 06-01-16 (THURSDAY) Report to [place] (2ND DESK ON RIGHT) @ 7:45 AM  Remember: Instructions that are not followed completely may result in serious medical risk, up to and including death, or upon the discretion of your surgeon and anesthesiologist your surgery may need to be rescheduled.    _x___ 1. Do not eat food or drink liquids after midnight. No gum chewing or hard candies.     __x__ 2. No Alcohol for 24 hours before or after surgery.   __x__3. No Smoking for 24 prior to surgery.   ____  4. Bring all medications with you on the day of surgery if instructed.    __x__ 5. Notify your doctor if there is any change in your medical condition     (cold, fever, infections).     Do not wear jewelry, make-up, hairpins, clips or nail polish.  Do not wear lotions, powders, or perfumes. You may wear deodorant.  Do not shave 48 hours prior to surgery. Men may shave face and neck.  Do not bring valuables to the hospital.    [facility] is not responsible for any belongings or valuables.               Contacts, dentures or bridgework may not be worn into surgery.  Leave your suitcase in the car. After surgery it may be brought to your room.  For patients admitted to the hospital, discharge time is determined by your treatment team.   Patients discharged the day of surgery will not be allowed to drive home.  You will need someone to drive you home and stay with you the night of your procedure.    Please read over the following fact sheets that you were given:   [facility] Preparing for Surgery and or MRSA Information   _x___ Take these medicines the morning of surgery with A SIP OF WATER:    1. PANTOPRAZOLE  2. TAKE AN EXTRA PANTOPRAZOLE ON Wednesday NIGHT BEFORE BED  3.  4.  5.  6.  ____Fleets enema or Magnesium Citrate as directed.   _x___ Use CHG Soap or sage wipes as directed on instruction sheet   ____ Use inhalers on the day  of surgery and bring to hospital day of surgery  ____ Stop metformin 2 days prior to surgery    ____ Take 1/2 of usual insulin dose the night before surgery and none on the morning of surgery.   ____ Stop Aspirin, Coumadin, Pllavix ,Eliquis, Effient, or Pradaxa-OK TO CONTINUE 81 MG ASPIRIN-DO NOT TAKE DAY OF SURGERY  __ Stop Anti-inflammatories such as Advil, Aleve, Ibuprofen, Motrin, Naproxen,          Naprosyn, Goodies powders or aspirin products. Ok to take Tylenol.   ____ Stop supplements until after surgery.    ____ Bring C-Pap to the hospital.

## 2016-05-26 ENCOUNTER — Ambulatory Visit
Admission: RE | Admit: 2016-05-26 | Discharge: 2016-05-26 | Disposition: A | Discharge status: Home / Self Care | Payer: Medicare HMO | Source: Ambulatory Visit | Attending: Internal Medicine | Admitting: Internal Medicine

## 2016-05-26 DIAGNOSIS — R921 Mammographic calcification found on diagnostic imaging of breast: Secondary | ICD-10-CM

## 2016-05-26 DIAGNOSIS — R928 Other abnormal and inconclusive findings on diagnostic imaging of breast: Secondary | ICD-10-CM

## 2016-05-26 DIAGNOSIS — N62 Hypertrophy of breast: Secondary | ICD-10-CM | POA: Insufficient documentation

## 2016-05-29 ENCOUNTER — Encounter
Admission: RE | Admit: 2016-05-29 | Discharge: 2016-05-29 | Disposition: A | Discharge status: Home / Self Care | Payer: Medicare HMO | Source: Ambulatory Visit | Attending: General Surgery | Admitting: General Surgery

## 2016-05-29 ENCOUNTER — Encounter: Payer: Self-pay | Admitting: General Surgery

## 2016-05-29 ENCOUNTER — Ambulatory Visit (INDEPENDENT_AMBULATORY_CARE_PROVIDER_SITE_OTHER): Payer: Medicare HMO | Admitting: General Surgery

## 2016-05-29 ENCOUNTER — Inpatient Hospital Stay: Payer: Self-pay

## 2016-05-29 VITALS — BP 122/68 | HR 70 | Resp 12 | Ht 67.0 in | Wt 171.0 lb

## 2016-05-29 DIAGNOSIS — C50911 Malignant neoplasm of unspecified site of right female breast: Secondary | ICD-10-CM

## 2016-05-29 DIAGNOSIS — Z90710 Acquired absence of both cervix and uterus: Secondary | ICD-10-CM | POA: Diagnosis not present

## 2016-05-29 DIAGNOSIS — N6310 Unspecified lump in the right breast, unspecified quadrant: Secondary | ICD-10-CM | POA: Diagnosis not present

## 2016-05-29 DIAGNOSIS — C50919 Malignant neoplasm of unspecified site of unspecified female breast: Secondary | ICD-10-CM | POA: Insufficient documentation

## 2016-05-29 DIAGNOSIS — Z01810 Encounter for preprocedural cardiovascular examination: Secondary | ICD-10-CM

## 2016-05-29 DIAGNOSIS — Z8542 Personal history of malignant neoplasm of other parts of uterus: Secondary | ICD-10-CM | POA: Diagnosis not present

## 2016-05-29 DIAGNOSIS — Z01812 Encounter for preprocedural laboratory examination: Secondary | ICD-10-CM

## 2016-05-29 DIAGNOSIS — N6091 Unspecified benign mammary dysplasia of right breast: Secondary | ICD-10-CM | POA: Diagnosis not present

## 2016-05-29 DIAGNOSIS — Z882 Allergy status to sulfonamides status: Secondary | ICD-10-CM | POA: Diagnosis not present

## 2016-05-29 DIAGNOSIS — Z79899 Other long term (current) drug therapy: Secondary | ICD-10-CM | POA: Diagnosis not present

## 2016-05-29 DIAGNOSIS — Z170 Estrogen receptor positive status [ER+]: Secondary | ICD-10-CM | POA: Diagnosis not present

## 2016-05-29 DIAGNOSIS — Z7982 Long term (current) use of aspirin: Secondary | ICD-10-CM | POA: Diagnosis not present

## 2016-05-29 DIAGNOSIS — Z807 Family history of other malignant neoplasms of lymphoid, hematopoietic and related tissues: Secondary | ICD-10-CM | POA: Diagnosis not present

## 2016-05-29 DIAGNOSIS — E785 Hyperlipidemia, unspecified: Secondary | ICD-10-CM | POA: Diagnosis not present

## 2016-05-29 DIAGNOSIS — K219 Gastro-esophageal reflux disease without esophagitis: Secondary | ICD-10-CM | POA: Diagnosis not present

## 2016-05-29 DIAGNOSIS — Z803 Family history of malignant neoplasm of breast: Secondary | ICD-10-CM | POA: Diagnosis not present

## 2016-05-29 DIAGNOSIS — Z808 Family history of malignant neoplasm of other organs or systems: Secondary | ICD-10-CM | POA: Diagnosis not present

## 2016-05-29 DIAGNOSIS — Z888 Allergy status to other drugs, medicaments and biological substances status: Secondary | ICD-10-CM | POA: Diagnosis not present

## 2016-05-29 LAB — SURGICAL PATHOLOGY

## 2016-05-29 LAB — BASIC METABOLIC PANEL
Anion gap: 6 (ref 5–15)
BUN: 24 mg/dL — ABNORMAL HIGH (ref 6–20)
CALCIUM: 8.9 mg/dL (ref 8.9–10.3)
CO2: 29 mmol/L (ref 22–32)
CREATININE: 0.97 mg/dL (ref 0.44–1.00)
Chloride: 106 mmol/L (ref 101–111)
GFR calc non Af Amer: 56 mL/min — ABNORMAL LOW (ref >60)
Glucose, Bld: 100 mg/dL — ABNORMAL HIGH (ref 65–99)
Potassium: 4 mmol/L (ref 3.5–5.1)
SODIUM: 141 mmol/L (ref 135–145)

## 2016-05-29 NOTE — Progress Notes (Signed)
Patient ID: Courtney Herrera, female   DOB: Mar 06, 1941, 75 y.o.   MRN: [id number]  Chief Complaint  Patient presents with  . Pre-op Exam    HPI Courtney Herrera is a 76 y.o. female here today for her pre op right breat lumpectomy scheduled on 06/01/2016.  The patient tolerated bilateral stereotactic biopsies completed on 05/26/2016 with moderate discomfort during the left breast procedure. These biopsies were done for the identification of microcalcifications on her recent mammogram.   HPI  Past Medical History:  Diagnosis Date  . 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 GERD (gastroesophageal reflux disease)   . Hyperlipidemia     Past Surgical History:  Procedure Laterality Date  . ABDOMINAL HYSTERECTOMY  2012  . BREAST BIOPSY Left    neg  . BREAST BIOPSY Right 05/11/2016   INVASIVE MAMMARY CARCINOMA  . BREAST BIOPSY Bilateral 05/26/2016   bilat stereo path pending  . BREAST CYST ASPIRATION Left    neg    Family History  Problem Relation Age of Onset  . Breast cancer Sister 84  . Cancer Brother     brain  . Cancer Sister   . Non-Hodgkin's lymphoma Brother     Social History Social History  Substance Use Topics  . Smoking status: Never Smoker  . Smokeless tobacco: Never Used  . Alcohol use No    Allergies  Allergen Reactions  . Sulphadimidine [Sulfamethazine] Swelling  . Petrolatum-Zinc Oxide Swelling    Current Outpatient Prescriptions  Medication Sig Dispense Refill  . acetaminophen (TYLENOL) 500 MG tablet Take 1,000 mg by mouth every 6 (six) hours as needed for mild pain.    Marland Kitchen aspirin 81 MG chewable tablet Chew 81 mg by mouth at bedtime.     Marland Kitchen atorvastatin (LIPITOR) 80 MG tablet Take 80 mg by mouth daily at 6 PM.     . furosemide (LASIX) 20 MG tablet Take 20 mg by mouth daily as needed for fluid.     . pantoprazole (PROTONIX) 40 MG tablet Take 40 mg by mouth every morning.     . niacin (NIASPAN) 500 MG CR tablet Take 1 mg by mouth at bedtime.      No  current facility-administered medications for this visit.     Review of Systems Review of Systems  Blood pressure 122/68, pulse 70, resp. rate 12, height 5\' 7"  (1.702 m), weight 171 lb (77.6 kg).  Physical Exam Physical Exam  Constitutional: She is oriented to person, place, and time. She appears well-developed and well-nourished.  Eyes: Conjunctivae are normal. No scleral icterus.  Neck: Neck supple.  Cardiovascular: Normal rate, regular rhythm and normal heart sounds.   Pulmonary/Chest: Effort normal and breath sounds normal. Right breast exhibits no inverted nipple, no nipple discharge, no skin change and no tenderness. Left breast exhibits no inverted nipple, no mass, no nipple discharge, no skin change and no tenderness.     Lymphadenopathy:    She has no cervical adenopathy.    She has no axillary adenopathy.  Neurological: She is alert and oriented to person, place, and time.  Skin: Skin is warm and dry.    Data Reviewed Postbiopsy mammograms of 05/26/2016 were reviewed.  05/26/2016 biopsy results:  A. RIGHT BREAST, OUTER; BIOPSY:  - MINUTE FOCUS OF ATYPICAL DUCTAL HYPERPLASIA, 1 MM.  - MICROCALCIFICATIONS IN BENIGN BREAST TISSUE.   B. LEFT BREAST, OUTER; BIOPSY:  - COLUMNAR CELL CHANGE AND MICROCYSTS WITH ASSOCIATED  MICROCALCIFICATIONS.   Ultrasound examination of  the right breast was undertaken to determine if preoperative needle localization would be required.  The area of ADH recently biopsied is well visualized at the 9:00 position of the right breast, 9 cm from the nipple. The cavity measured 0.6 x 1.1 x 2.37 cm. The biopsy clip is evident.  The original site of the histologic grade 2 invasive mammary carcinoma remains visible at the 8:00 position, 7 cm from the nipple. The biopsy cavity now measuring 0.4 x 0.77 x 1.36 cm. The biopsy clip is evident. BI-RADS-6.  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  Benign microcalcifications of the  left breast.      Plan    The patient is a good candidate for breast conservation, especially as the right breast is approximately 10-15% larger than the left.  Biopsy procedure as well as that the sentinel node procedure was reviewed.        This information has been scribed by Gaspar Cola CMA.    Robert Bellow 05/29/2016, 4:25 PM

## 2016-05-30 ENCOUNTER — Ambulatory Visit: Payer: Medicare HMO

## 2016-06-01 ENCOUNTER — Ambulatory Visit: Payer: Medicare HMO | Admitting: Certified Registered Nurse Anesthetist

## 2016-06-01 ENCOUNTER — Ambulatory Visit
Admission: RE | Admit: 2016-06-01 | Discharge: 2016-06-01 | Disposition: A | Discharge status: Home / Self Care | Payer: Medicare HMO | Source: Ambulatory Visit | Attending: General Surgery | Admitting: General Surgery

## 2016-06-01 ENCOUNTER — Encounter: Payer: Self-pay | Admitting: *Deleted

## 2016-06-01 ENCOUNTER — Encounter
Admission: RE | Admit: 2016-06-01 | Discharge: 2016-06-01 | Disposition: A | Discharge status: Home / Self Care | Payer: Medicare HMO | Source: Ambulatory Visit | Attending: General Surgery | Admitting: General Surgery

## 2016-06-01 ENCOUNTER — Encounter
Admission: RE | Disposition: A | Discharge status: Home / Self Care | Payer: Self-pay | Source: Ambulatory Visit | Attending: General Surgery

## 2016-06-01 DIAGNOSIS — C50511 Malignant neoplasm of lower-outer quadrant of right female breast: Secondary | ICD-10-CM | POA: Diagnosis not present

## 2016-06-01 DIAGNOSIS — E785 Hyperlipidemia, unspecified: Secondary | ICD-10-CM | POA: Insufficient documentation

## 2016-06-01 DIAGNOSIS — Z8542 Personal history of malignant neoplasm of other parts of uterus: Secondary | ICD-10-CM | POA: Insufficient documentation

## 2016-06-01 DIAGNOSIS — Z808 Family history of malignant neoplasm of other organs or systems: Secondary | ICD-10-CM | POA: Insufficient documentation

## 2016-06-01 DIAGNOSIS — Z888 Allergy status to other drugs, medicaments and biological substances status: Secondary | ICD-10-CM | POA: Insufficient documentation

## 2016-06-01 DIAGNOSIS — Z7982 Long term (current) use of aspirin: Secondary | ICD-10-CM | POA: Insufficient documentation

## 2016-06-01 DIAGNOSIS — C50911 Malignant neoplasm of unspecified site of right female breast: Secondary | ICD-10-CM

## 2016-06-01 DIAGNOSIS — Z90710 Acquired absence of both cervix and uterus: Secondary | ICD-10-CM | POA: Insufficient documentation

## 2016-06-01 DIAGNOSIS — Z807 Family history of other malignant neoplasms of lymphoid, hematopoietic and related tissues: Secondary | ICD-10-CM | POA: Insufficient documentation

## 2016-06-01 DIAGNOSIS — Z170 Estrogen receptor positive status [ER+]: Principal | ICD-10-CM

## 2016-06-01 DIAGNOSIS — Z882 Allergy status to sulfonamides status: Secondary | ICD-10-CM | POA: Insufficient documentation

## 2016-06-01 DIAGNOSIS — Z79899 Other long term (current) drug therapy: Secondary | ICD-10-CM | POA: Insufficient documentation

## 2016-06-01 DIAGNOSIS — Z803 Family history of malignant neoplasm of breast: Secondary | ICD-10-CM | POA: Insufficient documentation

## 2016-06-01 DIAGNOSIS — N6310 Unspecified lump in the right breast, unspecified quadrant: Secondary | ICD-10-CM

## 2016-06-01 DIAGNOSIS — K219 Gastro-esophageal reflux disease without esophagitis: Secondary | ICD-10-CM | POA: Insufficient documentation

## 2016-06-01 HISTORY — PX: BREAST LUMPECTOMY WITH SENTINEL LYMPH NODE BIOPSY: SHX5597

## 2016-06-01 HISTORY — PX: BREAST EXCISIONAL BIOPSY: SUR124

## 2016-06-01 SURGERY — BREAST LUMPECTOMY WITH SENTINEL LYMPH NODE BX
Anesthesia: General | Laterality: Right | Wound class: Clean

## 2016-06-01 MED ORDER — DEXAMETHASONE SODIUM PHOSPHATE 10 MG/ML IJ SOLN
INTRAMUSCULAR | Status: AC
  Filled 2016-06-01: qty 1

## 2016-06-01 MED ORDER — LIDOCAINE HCL (PF) 2 % IJ SOLN
INTRAMUSCULAR | Status: AC
  Filled 2016-06-01: qty 2

## 2016-06-01 MED ORDER — PROPOFOL 10 MG/ML IV BOLUS
INTRAVENOUS | Status: DC | PRN
  Administered 2016-06-01: 150 mg via INTRAVENOUS

## 2016-06-01 MED ORDER — FENTANYL CITRATE (PF) 100 MCG/2ML IJ SOLN
INTRAMUSCULAR | Status: DC | PRN
  Administered 2016-06-01: 50 ug via INTRAVENOUS
  Administered 2016-06-01: 25 ug via INTRAVENOUS
  Administered 2016-06-01: 50 ug via INTRAVENOUS
  Administered 2016-06-01: 25 ug via INTRAVENOUS

## 2016-06-01 MED ORDER — DEXAMETHASONE SODIUM PHOSPHATE 10 MG/ML IJ SOLN
INTRAMUSCULAR | Status: DC | PRN
  Administered 2016-06-01: 10 mg via INTRAVENOUS

## 2016-06-01 MED ORDER — SUGAMMADEX SODIUM 200 MG/2ML IV SOLN
INTRAVENOUS | Status: AC
  Filled 2016-06-01: qty 2

## 2016-06-01 MED ORDER — TECHNETIUM TC 99M SULFUR COLLOID FILTERED
1.0000 | Freq: Once | INTRAVENOUS | Status: AC | PRN
  Administered 2016-06-01: 0.727 via INTRADERMAL

## 2016-06-01 MED ORDER — EPHEDRINE SULFATE 50 MG/ML IJ SOLN
INTRAMUSCULAR | Status: DC | PRN
  Administered 2016-06-01 (×2): 5 mg via INTRAVENOUS

## 2016-06-01 MED ORDER — FENTANYL CITRATE (PF) 100 MCG/2ML IJ SOLN
INTRAMUSCULAR | Status: AC
  Filled 2016-06-01: qty 2

## 2016-06-01 MED ORDER — MIDAZOLAM HCL 2 MG/2ML IJ SOLN
INTRAMUSCULAR | Status: AC
  Filled 2016-06-01: qty 2

## 2016-06-01 MED ORDER — MIDAZOLAM HCL 2 MG/2ML IJ SOLN
INTRAMUSCULAR | Status: DC | PRN
  Administered 2016-06-01: 2 mg via INTRAVENOUS

## 2016-06-01 MED ORDER — KETOROLAC TROMETHAMINE 30 MG/ML IJ SOLN
INTRAMUSCULAR | Status: AC
  Filled 2016-06-01: qty 1

## 2016-06-01 MED ORDER — ACETAMINOPHEN 10 MG/ML IV SOLN
INTRAVENOUS | Status: AC
  Filled 2016-06-01: qty 100

## 2016-06-01 MED ORDER — KETOROLAC TROMETHAMINE 30 MG/ML IJ SOLN
INTRAMUSCULAR | Status: DC | PRN
  Administered 2016-06-01: 15 mg via INTRAVENOUS

## 2016-06-01 MED ORDER — HYDROCODONE-ACETAMINOPHEN 5-325 MG PO TABS: 1.0000 | ORAL_TABLET | ORAL | 0 refills | Status: DC | PRN

## 2016-06-01 MED ORDER — ONDANSETRON HCL 4 MG/2ML IJ SOLN
INTRAMUSCULAR | Status: DC | PRN
  Administered 2016-06-01: 4 mg via INTRAVENOUS

## 2016-06-01 MED ORDER — ONDANSETRON HCL 4 MG/2ML IJ SOLN
INTRAMUSCULAR | Status: AC
  Filled 2016-06-01: qty 2

## 2016-06-01 MED ORDER — METHYLENE BLUE 0.5 % INJ SOLN
INTRAVENOUS | Status: AC
  Filled 2016-06-01: qty 10

## 2016-06-01 MED ORDER — BUPIVACAINE-EPINEPHRINE (PF) 0.5% -1:200000 IJ SOLN
INTRAMUSCULAR | Status: AC
  Filled 2016-06-01: qty 30

## 2016-06-01 MED ORDER — BUPIVACAINE-EPINEPHRINE (PF) 0.5% -1:200000 IJ SOLN
INTRAMUSCULAR | Status: DC | PRN
  Administered 2016-06-01: 30 mL

## 2016-06-01 MED ORDER — ONDANSETRON HCL 4 MG/2ML IJ SOLN: 4.0000 mg | Freq: Once | INTRAMUSCULAR | Status: DC | PRN

## 2016-06-01 MED ORDER — FENTANYL CITRATE (PF) 100 MCG/2ML IJ SOLN
INTRAMUSCULAR | Status: AC
  Administered 2016-06-01: 25 ug via INTRAVENOUS
  Filled 2016-06-01: qty 2

## 2016-06-01 MED ORDER — HYDROCODONE-ACETAMINOPHEN 5-325 MG PO TABS
1.0000 | ORAL_TABLET | Freq: Once | ORAL | Status: AC
  Administered 2016-06-01: 1 via ORAL

## 2016-06-01 MED ORDER — LIDOCAINE HCL 2 % EX GEL
CUTANEOUS | Status: AC
  Filled 2016-06-01: qty 5

## 2016-06-01 MED ORDER — ACETAMINOPHEN 10 MG/ML IV SOLN
INTRAVENOUS | Status: DC | PRN
  Administered 2016-06-01: 1000 mg via INTRAVENOUS

## 2016-06-01 MED ORDER — LIDOCAINE HCL (CARDIAC) 20 MG/ML IV SOLN
INTRAVENOUS | Status: DC | PRN
  Administered 2016-06-01: 50 mg via INTRAVENOUS

## 2016-06-01 MED ORDER — HYDROCODONE-ACETAMINOPHEN 5-325 MG PO TABS
ORAL_TABLET | ORAL | Status: AC
  Filled 2016-06-01: qty 1

## 2016-06-01 MED ORDER — SEVOFLURANE IN SOLN
RESPIRATORY_TRACT | Status: AC
  Filled 2016-06-01: qty 250

## 2016-06-01 MED ORDER — LACTATED RINGERS IV SOLN
INTRAVENOUS | Status: DC
  Administered 2016-06-01: 09:00:00 via INTRAVENOUS

## 2016-06-01 MED ORDER — PROPOFOL 10 MG/ML IV BOLUS
INTRAVENOUS | Status: AC
  Filled 2016-06-01: qty 20

## 2016-06-01 MED ORDER — FENTANYL CITRATE (PF) 100 MCG/2ML IJ SOLN
25.0000 ug | INTRAMUSCULAR | Status: AC | PRN
  Administered 2016-06-01 (×6): 25 ug via INTRAVENOUS

## 2016-06-01 MED ORDER — METHYLENE BLUE 0.5 % INJ SOLN
INTRAVENOUS | Status: DC | PRN
  Administered 2016-06-01: 5 mL via INTRADERMAL

## 2016-06-01 SURGICAL SUPPLY — 51 items
BANDAGE ELASTIC 6 LF NS (GAUZE/BANDAGES/DRESSINGS) IMPLANT
BLADE SURG 15 STRL SS SAFETY (BLADE) ×2 IMPLANT
BNDG GAUZE 4.5X4.1 6PLY STRL (MISCELLANEOUS) IMPLANT
BRA SURGICAL LRG (MISCELLANEOUS) ×2 IMPLANT
BULB RESERV EVAC DRAIN JP 100C (MISCELLANEOUS) IMPLANT
CANISTER SUCT 1200ML W/VALVE (MISCELLANEOUS) ×2 IMPLANT
CHLORAPREP W/TINT 26ML (MISCELLANEOUS) ×2 IMPLANT
CNTNR SPEC 2.5X3XGRAD LEK (MISCELLANEOUS)
CONT SPEC 4OZ STER OR WHT (MISCELLANEOUS)
CONTAINER SPEC 2.5X3XGRAD LEK (MISCELLANEOUS) IMPLANT
COVER PROBE FLX POLY STRL (MISCELLANEOUS) ×2 IMPLANT
DEVICE DUBIN SPECIMEN MAMMOGRA (MISCELLANEOUS) ×2 IMPLANT
DRAIN CHANNEL JP 15F RND 16 (MISCELLANEOUS) IMPLANT
DRAPE LAPAROTOMY TRNSV 106X77 (MISCELLANEOUS) ×2 IMPLANT
DRSG TELFA 3X8 NADH (GAUZE/BANDAGES/DRESSINGS) ×2 IMPLANT
ELECT CAUTERY BLADE TIP 2.5 (TIP) ×2
ELECT REM PT RETURN 9FT ADLT (ELECTROSURGICAL) ×2
ELECTRODE CAUTERY BLDE TIP 2.5 (TIP) ×1 IMPLANT
ELECTRODE REM PT RTRN 9FT ADLT (ELECTROSURGICAL) ×1 IMPLANT
GAUZE FLUFF 18X24 1PLY STRL (GAUZE/BANDAGES/DRESSINGS) ×2 IMPLANT
GAUZE SPONGE 4X4 12PLY STRL (GAUZE/BANDAGES/DRESSINGS) IMPLANT
GLOVE BIO SURGEON STRL SZ7.5 (GLOVE) ×8 IMPLANT
GLOVE INDICATOR 8.0 STRL GRN (GLOVE) ×6 IMPLANT
GOWN STRL REUS W/ TWL LRG LVL3 (GOWN DISPOSABLE) ×2 IMPLANT
GOWN STRL REUS W/TWL LRG LVL3 (GOWN DISPOSABLE) ×2
HARMONIC SCALPEL FOCUS (MISCELLANEOUS) IMPLANT
KIT RM TURNOVER STRD PROC AR (KITS) ×2 IMPLANT
LABEL OR SOLS (LABEL) ×2 IMPLANT
MARGIN MAP 10MM (MISCELLANEOUS) ×2 IMPLANT
NDL SAFETY 22GX1.5 (NEEDLE) ×2 IMPLANT
NEEDLE HYPO 25X1 1.5 SAFETY (NEEDLE) ×4 IMPLANT
PACK BASIN MINOR ARMC (MISCELLANEOUS) ×2 IMPLANT
SHEARS FOC LG CVD HARMONIC 17C (MISCELLANEOUS) IMPLANT
SLEVE PROBE SENORX GAMMA FIND (MISCELLANEOUS) ×2 IMPLANT
STRIP CLOSURE SKIN 1/2X4 (GAUZE/BANDAGES/DRESSINGS) ×2 IMPLANT
SUT ETHILON 3-0 FS-10 30 BLK (SUTURE) ×2
SUT SILK 2 0 (SUTURE)
SUT SILK 2-0 18XBRD TIE 12 (SUTURE) IMPLANT
SUT VIC AB 2-0 CT1 27 (SUTURE) ×2
SUT VIC AB 2-0 CT1 TAPERPNT 27 (SUTURE) ×2 IMPLANT
SUT VIC AB 3-0 SH 27 (SUTURE)
SUT VIC AB 3-0 SH 27X BRD (SUTURE) IMPLANT
SUT VIC AB 4-0 FS2 27 (SUTURE) ×4 IMPLANT
SUT VICRYL+ 3-0 144IN (SUTURE) ×2 IMPLANT
SUTURE EHLN 3-0 FS-10 30 BLK (SUTURE) ×1 IMPLANT
SWABSTK COMLB BENZOIN TINCTURE (MISCELLANEOUS) ×2 IMPLANT
SYR BULB IRRIG 60ML STRL (SYRINGE) ×2 IMPLANT
SYR CONTROL 10ML (SYRINGE) ×2 IMPLANT
SYRINGE 10CC LL (SYRINGE) ×2 IMPLANT
TAPE TRANSPORE STRL 2 31045 (GAUZE/BANDAGES/DRESSINGS) ×2 IMPLANT
WATER STERILE IRR 1000ML POUR (IV SOLUTION) ×2 IMPLANT

## 2016-06-01 NOTE — Transfer of Care (Signed)
Immediate Anesthesia Transfer of Care Note  Patient: Courtney Herrera  Procedure(s) Performed: Procedure(s): BREAST LUMPECTOMY WITH SENTINEL LYMPH NODE BX (Right)  Patient Location: PACU  Anesthesia Type:General  Level of Consciousness: sedated  Airway & Oxygen Therapy: Patient Spontanous Breathing and Patient connected to face mask oxygen  Post-op Assessment: Report given to RN and Post -op Vital signs reviewed and stable  Post vital signs: Reviewed and stable  Last Vitals:  Vitals:   06/01/16 0846  BP: (!) 171/82  Pulse: 78  Resp: 18  Temp: 36.7 C    Last Pain:  Vitals:   06/01/16 0846  TempSrc: Oral  PainSc: 0-No pain         Complications: No apparent anesthesia complications

## 2016-06-01 NOTE — Anesthesia Preprocedure Evaluation (Signed)
Anesthesia Evaluation  Patient identified by MRN, date of birth, ID band Patient awake    Reviewed: Allergy & Precautions, NPO status , Patient's Chart, lab work & pertinent test results  Airway Mallampati: III  TM Distance: <3 FB     Dental  (+) Chipped, Caps   Pulmonary neg pulmonary ROS,    Pulmonary exam normal        Cardiovascular negative cardio ROS Normal cardiovascular exam     Neuro/Psych negative neurological ROS  negative psych ROS   GI/Hepatic Neg liver ROS, GERD  Medicated and Controlled,  Endo/Other  negative endocrine ROS  Renal/GU negative Renal ROS  Female GU complaint     Musculoskeletal negative musculoskeletal ROS (+)   Abdominal Normal abdominal exam  (+)   Peds negative pediatric ROS (+)  Hematology negative hematology ROS (+)   Anesthesia Other Findings Past Medical History: 2012: Cancer ([place])     Comment: uterine- radiation No date: GERD (gastroesophageal reflux disease) No date: Hyperlipidemia  Reproductive/Obstetrics                             Anesthesia Physical Anesthesia Plan  ASA: II  Anesthesia Plan: General   Post-op Pain Management:    Induction: Intravenous  Airway Management Planned: LMA  Additional Equipment:   Intra-op Plan:   Post-operative Plan: Extubation in OR  Informed Consent: I have reviewed the patients History and Physical, chart, labs and discussed the procedure including the risks, benefits and alternatives for the proposed anesthesia with the patient or authorized representative who has indicated his/her understanding and acceptance.   Dental advisory given  Plan Discussed with: CRNA and Surgeon  Anesthesia Plan Comments:         Anesthesia Quick Evaluation

## 2016-06-01 NOTE — Discharge Instructions (Signed)

## 2016-06-01 NOTE — Op Note (Signed)
Preoperative diagnosis: 1) invasive mammary carcinoma, right lower outer quadrant.; 2) foci of ADH right breast.  Postoperative diagnosis same colon.  Operative procedure: Right breast wide excision with sentinel node biopsy.  Operating surgeon: Ollen Bowl, M.D.  Anesthesia: Gen. by LMA, Marcaine 0.5% with 1-200,000 of epinephrine, 30 mL.  Estimated blood loss: Less than 20 mL.  Clinical note: This 76 year old woman had an abnormal mammogram and biopsy showed evidence of invasive mammary carcinoma. Ultrasound completed after this procedure showed the biopsy clip and cavity at the 6 cm from the nipple, at the 8:00-8:30 o'clock position. There was a foci of microcalcifications easily subsequently been biopsied showed evidence of ADH. Post procedure ultrasound showed a second biopsy cavity with the clip clearly identified. These lesions were within approximately 2 cm of each other.   Operative note: The patient underwent general anesthesia without difficulty. Table was rolled to the left and due to the "floppy" nature the breast the area was taped with sterile tape after skin preparation with ChloraPrep. Prior to formal prep of the skin 5 mL of methylene blue was injected in the right subareolar plexus (0.5%). The patient previously been injected with technetium sulfur colloid prior to presenting to the operating room.  Ultrasound was used to identify both biopsy cavities as they had been identified 3 days ago in the office. A block of tissue 6 x 6 x 4 cm was planned for resection. A radial incision at the 8-8:30 o'clock position was utilized. 20 mL of the above mentioned local anesthetic was infiltrated for postoperative analgesia. The skin was incised sharply and remaining dissection completed with electrocautery. Beginning just below the adipose layer he tissue described above was resected down to but not including the pectoralis fashion. Specimen radiograph confirmed only one clip present. This  was thought likely to represent the more cephalad clip at the area of ADH and an additional block of tissue inferior to the original site was excised. Specimen radiograph of this area showed no additional tissue. At this point it was elected not to resect any additional tissue until final processing.  Attention was turned to the axilla. Fairly low counts were noted. Local anesthetic was infiltrated. A transverse incision at the lower aspect of the axilla was made. Skin was incised sharply and the remaining dissection completed electrocautery. A single blue lymphatic was identified and this was followed down to a 8-10 mm lymph node which was resected and sent in formalin. Scanning to the axillary envelope showed no other areas of increased uptake although the pectoralis muscle itself had very high counts of 500. Examination above and below this area showed no evidence of additional nodal tissue.  The axillary wound was closed with 2-0 Vicryl figure-of-eight sutures to the axillary envelope and more superficially to the adipose layer. A running 4-0 Vicryl septic suture the skin.  The breast wound was closed in layers of 2-0 Vicryl figure-of-eight sutures. The skin was closed with a running 4-0 Vicryl septic suture. Benzoin and Steri-Strips were applied to both wounds. Telfa pad, fluff gauze and a surgical bra were applied.  The patient tolerated the procedure well was taken to the recovery room in stable condition.

## 2016-06-01 NOTE — H&P (Signed)
No change in clinical condition or plan. For right wide excision and SLN biopsy.

## 2016-06-01 NOTE — Anesthesia Post-op Follow-up Note (Cosign Needed)
Anesthesia QCDR form completed.        

## 2016-06-01 NOTE — Anesthesia Procedure Notes (Signed)
Procedure Name: LMA Insertion Date/Time: 06/01/2016 10:13 AM Performed by: Johnna Acosta Pre-anesthesia Checklist: Patient identified, Emergency Drugs available, Suction available, Patient being monitored and Timeout performed Patient Re-evaluated:Patient Re-evaluated prior to inductionOxygen Delivery Method: Circle system utilized Preoxygenation: Pre-oxygenation with 100% oxygen Intubation Type: IV induction LMA: LMA inserted LMA Size: 4.0 Tube type: Oral Number of attempts: 1 Placement Confirmation: positive ETCO2 and breath sounds checked- equal and bilateral Tube secured with: Tape Dental Injury: Teeth and Oropharynx as per pre-operative assessment

## 2016-06-01 NOTE — Progress Notes (Signed)
  Oncology Nurse Navigator Documentation  Navigator Location: [facility] Onc (06/01/16 1100)   )Navigator Encounter Type: Other (06/01/16 1100)       Surgery Date: 06/01/16 (06/01/16 1100)           Treatment Initiated Date: 06/01/16 (06/01/16 1100) Patient Visit Type: Surgery (06/01/16 1100)                              Time Spent with Patient: 15 (06/01/16 1100)   Met patient's family today during surgery.  Offered support.  They are to call if they have any questions or needs.

## 2016-06-02 NOTE — Anesthesia Postprocedure Evaluation (Signed)
Anesthesia Post Note  Patient: Courtney Herrera  Procedure(s) Performed: Procedure(s) (LRB): BREAST LUMPECTOMY WITH SENTINEL LYMPH NODE BX (Right)  Patient location during evaluation: PACU Anesthesia Type: General Level of consciousness: awake and alert and oriented Pain management: pain level controlled Vital Signs Assessment: post-procedure vital signs reviewed and stable Respiratory status: spontaneous breathing Cardiovascular status: blood pressure returned to baseline Anesthetic complications: no     Last Vitals:  Vitals:   06/01/16 1324 06/01/16 1416  BP: (!) 150/73 132/71  Pulse: 76   Resp: 14   Temp: 36.7 C     Last Pain:  Vitals:   06/02/16 0906  TempSrc:   PainSc: 1                  Antwian Santaana

## 2016-06-05 ENCOUNTER — Encounter: Payer: Self-pay | Admitting: Diagnostic Radiology

## 2016-06-05 LAB — SURGICAL PATHOLOGY

## 2016-06-06 ENCOUNTER — Ambulatory Visit (INDEPENDENT_AMBULATORY_CARE_PROVIDER_SITE_OTHER): Payer: Medicare HMO | Admitting: General Surgery

## 2016-06-06 ENCOUNTER — Encounter: Payer: Self-pay | Admitting: Diagnostic Radiology

## 2016-06-06 ENCOUNTER — Encounter: Payer: Self-pay | Admitting: General Surgery

## 2016-06-06 VITALS — BP 140/80 | HR 83 | Resp 12 | Ht 67.0 in | Wt 172.0 lb

## 2016-06-06 DIAGNOSIS — Z170 Estrogen receptor positive status [ER+]: Secondary | ICD-10-CM

## 2016-06-06 DIAGNOSIS — C50911 Malignant neoplasm of unspecified site of right female breast: Secondary | ICD-10-CM

## 2016-06-06 NOTE — Progress Notes (Signed)
Patient ID: Courtney Herrera, female   DOB: [birth year], 76 y.o.   MRN: [id number]  Chief Complaint  Patient presents with  . Routine Post Op    HPI Courtney Herrera is a 76 y.o. female here today for her post op right lumpectomy done on 06/01/2016. Patient states she is very sore and having some pain.  HPI  Past Medical History:  Diagnosis Date  . Cancer [facility]) 2012   uterine- radiation  . GERD (gastroesophageal reflux disease)   . Hyperlipidemia     Past Surgical History:  Procedure Laterality Date  . ABDOMINAL HYSTERECTOMY  2012  . BREAST BIOPSY Left    neg  . BREAST BIOPSY Right 05/11/2016   INVASIVE MAMMARY CARCINOMA  . BREAST BIOPSY Bilateral 05/26/2016   bilat stereo path pending  . BREAST CYST ASPIRATION Left    neg  . BREAST EXCISIONAL BIOPSY Right 06/01/2016   + lumpectomy  . BREAST LUMPECTOMY WITH SENTINEL LYMPH NODE BIOPSY Right 06/01/2016   Procedure: BREAST LUMPECTOMY WITH SENTINEL LYMPH NODE BX;  Surgeon: Robert Bellow, MD;  Location: [facility] ORS;  Service: General;  Laterality: Right;    Family History  Problem Relation Age of Onset  . Breast cancer Sister 24  . Cancer Brother     brain  . Cancer Sister   . Non-Hodgkin's lymphoma Brother     Social History Social History  Substance Use Topics  . Smoking status: Never Smoker  . Smokeless tobacco: Never Used  . Alcohol use No    Allergies  Allergen Reactions  . Sulphadimidine [Sulfamethazine] Swelling  . Petrolatum-Zinc Oxide Swelling    Current Outpatient Prescriptions  Medication Sig Dispense Refill  . acetaminophen (TYLENOL) 500 MG tablet Take 1,000 mg by mouth every 6 (six) hours as needed for mild pain.    Marland Kitchen aspirin 81 MG chewable tablet Chew 81 mg by mouth at bedtime.     Marland Kitchen atorvastatin (LIPITOR) 80 MG tablet Take 80 mg by mouth daily at 6 PM.     . furosemide (LASIX) 20 MG tablet Take 20 mg by mouth daily as needed for fluid.     Marland Kitchen HYDROcodone-acetaminophen (NORCO) 5-325 MG tablet Take 1-2  tablets by mouth every 4 (four) hours as needed. 30 tablet 0  . pantoprazole (PROTONIX) 40 MG tablet Take 40 mg by mouth every morning.     . niacin (NIASPAN) 500 MG CR tablet Take 1 mg by mouth at bedtime.      No current facility-administered medications for this visit.     Review of Systems Review of Systems  Constitutional: Negative.   Respiratory: Negative.   Cardiovascular: Negative.     Blood pressure 140/80, pulse 83, resp. rate 12, height _0  (1.702 m), weight 172 lb (78 kg).  Physical Exam Physical Exam  Constitutional: She is oriented to person, place, and time. She appears well-developed and well-nourished.  Pulmonary/Chest:    Right breast incision is clean and healing well.   Neurological: She is alert and oriented to person, place, and time.  Skin: Skin is warm and dry.    Data Reviewed  A. RIGHT BREAST; WIDE EXCISION:  - INVASIVE MAMMARY CARCINOMA WITH APOCRINE FEATURES.  - BIOPSY SITE CHANGES, MARKER CLIP PRESENT.  - THE SURGICAL MARGINS ARE NEGATIVE.  - DCIS is present in tissue between the two biopsy sites which is  most consistent with a single mass comprising invasive and in situ  carcinoma.  C. SENTINEL LYMPH NODE 1; EXCISION:  -  NO TUMOR SEEN IN ONE LYMPH NODE (0/1). DCIS is present in tissue between the two biopsy sites which is  most consistent with a single mass comprising invasive and in situ  carcinoma.  Estrogen Receptor (ER) Status: POSITIVE, >90%  Progesterone Receptor (PgR) Status: NEGATIVE  HER2 (by immunohistochemistry): EQUIVOCAL, 2+ HER2 (ERBB2) (by in situ hybridization): NEGATIVE     April 05, 2016 American Society of Breast Surgeons Consensus statement: Current ASCO/SSO/ASTRO and NCCN guidelines recommend using "no ink on the tumor" as a definition of negative margin for invasive breast cancer (with or without DCIS) undergoing lumpectomy with whole breast radiation.    Assessment    Stage 1 breast cancer.     Plan     Case reviewed with pathologist.  Area of DCIS corresponds to stereotactic biopsy area of ADH. Extends to site of 1.2 cm invasive mammary carcinoma. T1c, N0.  Original biopsy clip of site of [facility] not in resected specimen, likely related to clip migration. Tumor site completely removed.  Indications for Mammoprint assay reviewed with the patient and her husband.  Still very sore, and will defer referral to radiation oncology at this time.  To continue to wear her bra day and night.      This information has been scribed by Gaspar Cola CMA.   Robert Bellow 06/06/2016, 9:15 PM

## 2016-06-07 NOTE — Progress Notes (Signed)
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  INTERVAL HISTORY: Patient returns to clinic today to discuss her pathology results and treatment planning. She tolerated her lumpectomy well without significant side effects. Currently, she is anxious but otherwise feels well. She has no neurologic complaints. She denies any recent fevers or illnesses. She has a good appetite and denies weight loss. She denies any pain. She has no chest pain or shortness of breath. She denies any nausea, vomiting, constipation, or diarrhea. She has no urinary complaints. Patient offers no further specific complaints today.  REVIEW OF SYSTEMS:   Review of Systems  Constitutional: Negative.  Negative for fever, malaise/fatigue and weight loss.  Respiratory: Negative.  Negative for cough and shortness of breath.   Cardiovascular: Negative.  Negative for chest pain and leg swelling.  Gastrointestinal: Negative.  Negative for abdominal pain.  Genitourinary: Negative.   Musculoskeletal: Negative.   Neurological: Negative.  Negative for weakness.  Psychiatric/Behavioral: Negative.  The patient is not nervous/anxious.     As per HPI. Otherwise, a complete review of systems is negative.  PAST MEDICAL HISTORY: Past Medical History:  Diagnosis Date  . 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 GERD (gastroesophageal reflux disease)   . Hyperlipidemia     PAST SURGICAL HISTORY: Past Surgical History:  Procedure Laterality Date  . ABDOMINAL HYSTERECTOMY  2012  . BREAST BIOPSY Left    neg  . BREAST BIOPSY  Right 05/11/2016   INVASIVE MAMMARY CARCINOMA  . BREAST BIOPSY Bilateral 05/26/2016   bilat stereo path pending  . BREAST CYST ASPIRATION Left    neg  . BREAST EXCISIONAL BIOPSY Right 06/01/2016   + lumpectomy  . BREAST LUMPECTOMY WITH SENTINEL LYMPH NODE BIOPSY Right 06/01/2016   Procedure: BREAST LUMPECTOMY WITH SENTINEL LYMPH NODE BX;  Surgeon: Robert Bellow, MD;  Location: [facility] ORS;  Service: General;  Laterality: Right;    FAMILY HISTORY: Family History  Problem Relation Age of Onset  . Breast cancer Sister 80  . Cancer Brother     brain  . Cancer Sister   . Non-Hodgkin's lymphoma Brother     ADVANCED DIRECTIVES (Y/N):  N  HEALTH MAINTENANCE: Social History  Substance Use Topics  . Smoking status: Never Smoker  . Smokeless tobacco: Never Used  . Alcohol use No     Colonoscopy:  PAP:  Bone density:  Lipid panel:  Allergies  Allergen Reactions  . Sulphadimidine [Sulfamethazine] Swelling  . Petrolatum-Zinc Oxide Swelling    Current Outpatient Prescriptions  Medication Sig Dispense Refill  . acetaminophen (TYLENOL) 500 MG tablet Take 1,000 mg by mouth every 6 (six) hours as needed for mild pain.    Marland Kitchen aspirin 81 MG chewable tablet Chew 81 mg by mouth at bedtime.     Marland Kitchen atorvastatin (LIPITOR) 80 MG tablet Take 80 mg by mouth daily at 6 PM.     . furosemide (LASIX) 20 MG tablet Take 20 mg by mouth daily as needed for fluid.     . niacin (NIASPAN) 500 MG CR tablet Take 1 mg by mouth at bedtime.     . pantoprazole (PROTONIX)  40 MG tablet Take 40 mg by mouth every morning.     Marland Kitchen HYDROcodone-acetaminophen (NORCO) 5-325 MG tablet Take 1-2 tablets by mouth every 4 (four) hours as needed. (Patient not taking: Reported on 06/08/2016) 30 tablet 0   No current facility-administered medications for this visit.     OBJECTIVE: Vitals:   06/08/16 1036  BP: (!) 164/109  Pulse: (!) 112  Temp: 98.4 F (36.9 C)     Body mass index is 27.04 kg/m.    ECOG FS:0 -  Asymptomatic  General: Well-developed, well-nourished, no acute distress. Eyes: Pink conjunctiva, anicteric sclera. Breasts: Well healing surgical scar on right breast. Lungs: Clear to auscultation bilaterally. Heart: Regular rate and rhythm. No rubs, murmurs, or gallops. Abdomen: Soft, nontender, nondistended. No organomegaly noted, normoactive bowel sounds. Musculoskeletal: No edema, cyanosis, or clubbing. Neuro: Alert, answering all questions appropriately. Cranial nerves grossly intact. Skin: No rashes or petechiae noted. Psych: Normal affect.   LAB RESULTS:  Lab Results  Component Value Date   NA 141 05/29/2016   K 4.0 05/29/2016   CL 106 05/29/2016   CO2 29 05/29/2016   GLUCOSE 100 (H) 05/29/2016   BUN 24 (H) 05/29/2016   CREATININE 0.97 05/29/2016   CALCIUM 8.9 05/29/2016   PROT 7.8 07/08/2012   ALBUMIN 4.2 07/08/2012   AST 32 07/08/2012   ALT 24 07/08/2012   ALKPHOS 70 07/08/2012   BILITOT 0.4 07/08/2012   GFRNONAA 56 (L) 05/29/2016   GFRAA >60 05/29/2016    Lab Results  Component Value Date   WBC 7.2 07/08/2012   HGB 12.9 07/08/2012   HCT 39.6 07/08/2012   MCV 88 07/08/2012   PLT 166 07/08/2012     STUDIES: Nm Sentinel Node Injection  Result Date: 06/01/2016 CLINICAL DATA:  Right breast cancer. EXAM: NUCLEAR MEDICINE BREAST LYMPHOSCINTIGRAPHY TECHNIQUE: Intradermal injection of radiopharmaceutical was performed at the 12 o'clock, 3 o'clock, 6 o'clock, and 9 o'clock positions around the right nipple. The patient was then sent to the operating room where the sentinel node(s) were identified and removed by the surgeon. RADIOPHARMACEUTICALS:  Total of 1 mCi Millipore-filtered Technetium-63msulfur colloid, injected in four aliquots of 0.25 mCi each. IMPRESSION: Uncomplicated intradermal injection of a total of 1 mCi Technetium-9106mulfur colloid for purposes of sentinel node identification. Electronically Signed   By: JoSandi Mariscal.D.   On: 06/01/2016 10:15    UsKoreareast Complete Uni Right Inc Axilla  Result Date: 05/18/2016 Ultrasound examination of the right breast was undertaken to determine if the biopsy site was visible. At the 8-8:30 o'clock position, 6 cm from the nipple an ill-defined density measuring 0.74 x .17 x 1.3 forceps identified. Just slightly inferior and lateral to this the biopsy clip is identified.BIRAD-6.   Mm Clip Placement Left  Addendum Date: 05/30/2016   ADDENDUM REPORT: 05/30/2016 15:24 ADDENDUM: Pathology of the right breast biopsy revealed RIGHT BREAST, OUTER; BIOPSY: MINUTE FOCUS OF ATYPICAL DUCTAL HYPERPLASIA, 1 MM. MICROCALCIFICATIONS IN BENIGN BREAST TISSUE. This was found to be concordant by Dr. TuRadford PaxRecommendation: Surgical excision. Follow-up with Dr. ByBary Castillaor recently diagnosed right breast cancer. The patient had a preoperative appointment with Dr. ByBary Castillan 05/29/16. Results were discussed with the patient by Dr. ByBary CastillaPer the patient, she is scheduled for right breast surgery on 06/01/16. The patient was [facility] on 05/30/16 for a post biopsy site check. The patient stated she had some mild bleeding on the day following the biopsy. She stated she  is doing well now with no bleeding, bruising, or hematoma. Post biopsy instructions were reviewed with the patient. All of her questions were answered. She was encouraged to call the [facility] with any further questions or concerns. Addendum by Freeman Caldron, RRA on 05/30/16. Electronically Signed   By: Britta Mccreedy M.D.   On: 05/30/2016 15:24   Result Date: 05/30/2016 CLINICAL DATA:  Recent diagnosis of right breast cancer following ultrasound-guided biopsy of a right breast mass. Separate from the mass, there are clustered calcifications in the deep upper outer right breast for which biopsy is recommended. Calcifications are in the same quadrant as the biopsied mass. EXAM: RIGHT BREAST STEREOTACTIC CORE NEEDLE  BIOPSY COMPARISON:  Previous exams. FINDINGS: The patient and I discussed the procedure of stereotactic-guided biopsy including benefits and alternatives. We discussed the high likelihood of a successful procedure. We discussed the risks of the procedure including infection, bleeding, tissue injury, clip migration, and inadequate sampling. Informed written consent was given. The usual time out protocol was performed immediately prior to the procedure. Using sterile technique and 1% Lidocaine as local anesthetic, under stereotactic guidance, a 9 gauge vacuum assisted device was used to perform core needle biopsy of calcifications in the upper-outer quadrant of the right breast using a superior to inferior approach. Specimen radiograph was performed showing calcifications. Specimens with calcifications are identified for pathology. At the conclusion of the procedure, a top hat shaped tissue marker clip was deployed into the biopsy cavity. Follow-up 2-view mammogram was performed and dictated separately. IMPRESSION: Stereotactic-guided biopsy of the right breast. No apparent complications. Electronically Signed: By: Britta Mccreedy M.D. On: 05/26/2016 14:33   Mm Clip Placement Right  Result Date: 05/26/2016 CLINICAL DATA:  Bilateral stereotactic biopsies were performed today of calcifications in the upper-outer quadrant of the left breast and upper-outer quadrant of the right breast. The patient was recently diagnosed with right breast cancer. EXAM: DIAGNOSTIC BILATERAL MAMMOGRAM POST STEREOTACTIC BIOPSY COMPARISON:  Previous exam(s). FINDINGS: Mammographic images were obtained following stereotactic guided biopsy of calcifications in the upper-outer quadrant of the right breast and following stereotactic guided biopsy of calcifications in the upper-outer quadrant of the left breast. A top hat shaped biopsy clip is satisfactorily positioned in the deep upper outer quadrant of the right breast at the site of the  biopsied calcifications. This biopsy clip is approximately 2.8 cm posterior, superior, and lateral to the mass that was biopsied under ultrasound guidance. A cylindrical shaped biopsy clip is satisfactorily positioned in the posterior third of the upper outer quadrant of the left breast at the site of the biopsied calcifications. IMPRESSION: Satisfactory position of top hat shaped right breast biopsy clip and satisfactory position of cylindrical left breast biopsy clip. Final Assessment: Post Procedure Mammograms for Marker Placement Electronically Signed   By: Britta Mccreedy M.D.   On: 05/26/2016 14:39   Mm Lt Breast Bx W Loc Dev 1st Lesion Image Bx Spec Stereo Guide  Addendum Date: 05/30/2016   ADDENDUM REPORT: 05/30/2016 15:19 ADDENDUM: Pathology of the left breast biopsy revealed LEFT BREAST, OUTER; BIOPSY: COLUMNAR CELL CHANGE AND MICROCYSTS WITH ASSOCIATED MICROCALCIFICATIONS. Note: Results were called to Dr. Lemar Livings at 11 AM on 05/29/2016. This was found to be concordant by Dr. Mayford Knife. Recommendation: Follow-up with Dr. Lemar Livings for recently diagnosed RIGHT breast cancer. The patient had a preoperative appointment with Dr. Lemar Livings on 05/29/16. Results were discussed with the patient at that time by Dr. Lemar Livings. The patient was contacted by phone on 05/30/16 for  a post biopsy check-up by Jetta Lout, [facility]). The patient stated she had some mild bleeding on the day following the biopsy. She stated she is fine now with no bleeding, bruising, or hematoma. Post biopsy instructions were discussed with the patient. All of her questions were answered. She was encouraged to call the [facility] with any further questions or concerns. Addendum by Jetta Lout, RRA on 05/30/16. Electronically Signed   By: Curlene Dolphin M.D.   On: 05/30/2016 15:19   Result Date: 05/30/2016 CLINICAL DATA:  76 year old patient recently diagnosed with right breast cancer. Biopsy was suggested of a group of  calcifications in the posterior upper outer left breast. EXAM: LEFT BREAST STEREOTACTIC CORE NEEDLE BIOPSY COMPARISON:  Previous exams. FINDINGS: The patient and I discussed the procedure of stereotactic-guided biopsy including benefits and alternatives. We discussed the high likelihood of a successful procedure. We discussed the risks of the procedure including infection, bleeding, tissue injury, clip migration, and inadequate sampling. Informed written consent was given. The usual time out protocol was performed immediately prior to the procedure. Using sterile technique and 1% Lidocaine as local anesthetic, under stereotactic guidance, a 9 gauge vacuum assist device was used to perform core needle biopsy of calcifications in the upper-outer quadrant of the left breast using a superior to inferior approach. Specimen radiograph was performed showing several calcifications. Specimens with calcifications are identified for pathology. At the conclusion of the procedure, a cylindrical tissue marker clip was deployed into the biopsy cavity. Follow-up 2-view mammogram was performed and dictated separately. IMPRESSION: Stereotactic-guided biopsy of the left breast. No apparent complications. Electronically Signed: By: Curlene Dolphin M.D. On: 05/26/2016 14:36   Mm Rt Breast Bx W Loc Dev 1st Lesion Image Bx Spec Stereo Guide  Addendum Date: 05/30/2016   ADDENDUM REPORT: 05/30/2016 15:24 ADDENDUM: Pathology of the right breast biopsy revealed RIGHT BREAST, OUTER; BIOPSY: MINUTE FOCUS OF ATYPICAL DUCTAL HYPERPLASIA, 1 MM. MICROCALCIFICATIONS IN BENIGN BREAST TISSUE. This was found to be concordant by Dr. Radford Pax. Recommendation: Surgical excision. Follow-up with Dr. Bary Castilla for recently diagnosed right breast cancer. The patient had a preoperative appointment with Dr. Bary Castilla on 05/29/16. Results were discussed with the patient by Dr. Bary Castilla. Per the patient, she is scheduled for right breast surgery on 06/01/16. The patient  was contacted by Jetta Lout, [facility]) on 05/30/16 for a post biopsy site check. The patient stated she had some mild bleeding on the day following the biopsy. She stated she is doing well now with no bleeding, bruising, or hematoma. Post biopsy instructions were reviewed with the patient. All of her questions were answered. She was encouraged to call the [facility] with any further questions or concerns. Addendum by Jetta Lout, RRA on 05/30/16. Electronically Signed   By: Curlene Dolphin M.D.   On: 05/30/2016 15:24   Result Date: 05/30/2016 CLINICAL DATA:  Recent diagnosis of right breast cancer following ultrasound-guided biopsy of a right breast mass. Separate from the mass, there are clustered calcifications in the deep upper outer right breast for which biopsy is recommended. Calcifications are in the same quadrant as the biopsied mass. EXAM: RIGHT BREAST STEREOTACTIC CORE NEEDLE BIOPSY COMPARISON:  Previous exams. FINDINGS: The patient and I discussed the procedure of stereotactic-guided biopsy including benefits and alternatives. We discussed the high likelihood of a successful procedure. We discussed the risks of the procedure including infection, bleeding, tissue injury, clip migration, and inadequate sampling. Informed written consent was given. The usual time  out protocol was performed immediately prior to the procedure. Using sterile technique and 1% Lidocaine as local anesthetic, under stereotactic guidance, a 9 gauge vacuum assisted device was used to perform core needle biopsy of calcifications in the upper-outer quadrant of the right breast using a superior to inferior approach. Specimen radiograph was performed showing calcifications. Specimens with calcifications are identified for pathology. At the conclusion of the procedure, a top hat shaped tissue marker clip was deployed into the biopsy cavity. Follow-up 2-view mammogram was performed and dictated separately.  IMPRESSION: Stereotactic-guided biopsy of the right breast. No apparent complications. Electronically Signed: By: Curlene Dolphin M.D. 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  PLAN:    1. 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 Given patient's stage of disease multi-gene testing as been sent to determine if patient requires adjuvant chemotherapy or not. These resuls are pending. Patient will also require adjuvant XRT and a referral has been made to radiation oncology. Finally, patient will benefit from an aromatase inhibitor for 5 years given the ER positivity of her tumor. Return to clinic in 3 months at the conclusion of her XRT, unless her multi-gene panel returns high risk, then she will return to clinic sooner to discuss adjuvant chemotherapy.    Approximately 30 minutes was spent in discussion of which greater than 50% was consultation.  Patient expressed understanding and was in agreement with this plan. She also understands that She can call clinic at any time with any questions, concerns, or complaints.   Cancer Staging Primary cancer of upper outer quadrant of right female breast ([place]) HER 2 NEGATIVE  (FISH) Staging form: Breast, AJCC 8th Edition - Pathologic stage from 06/12/2016: Stage IA (pT1c, pN0, cM0, G2, ER: Positive, PR: Negative, HER2: Negative) - Signed by Lloyd Huger, MD on 06/12/2016   Lloyd Huger, MD   06/12/2016 8:26 AM

## 2016-06-08 ENCOUNTER — Encounter: Payer: Self-pay | Admitting: Oncology

## 2016-06-08 ENCOUNTER — Inpatient Hospital Stay (HOSPITAL_BASED_OUTPATIENT_CLINIC_OR_DEPARTMENT_OTHER): Payer: Medicare HMO | Admitting: Oncology

## 2016-06-08 ENCOUNTER — Encounter: Payer: Self-pay | Admitting: *Deleted

## 2016-06-08 VITALS — BP 164/109 | HR 112 | Temp 98.4°F | Wt 172.6 lb

## 2016-06-08 DIAGNOSIS — Z170 Estrogen receptor positive status [ER+]: Secondary | ICD-10-CM

## 2016-06-08 DIAGNOSIS — K219 Gastro-esophageal reflux disease without esophagitis: Secondary | ICD-10-CM | POA: Diagnosis not present

## 2016-06-08 DIAGNOSIS — Z803 Family history of malignant neoplasm of breast: Secondary | ICD-10-CM | POA: Diagnosis not present

## 2016-06-08 DIAGNOSIS — Z807 Family history of other malignant neoplasms of lymphoid, hematopoietic and related tissues: Secondary | ICD-10-CM

## 2016-06-08 DIAGNOSIS — E785 Hyperlipidemia, unspecified: Secondary | ICD-10-CM | POA: Diagnosis not present

## 2016-06-08 DIAGNOSIS — Z7982 Long term (current) use of aspirin: Secondary | ICD-10-CM | POA: Diagnosis not present

## 2016-06-08 DIAGNOSIS — C50411 Malignant neoplasm of upper-outer quadrant of right female breast: Secondary | ICD-10-CM

## 2016-06-08 DIAGNOSIS — F419 Anxiety disorder, unspecified: Secondary | ICD-10-CM | POA: Diagnosis not present

## 2016-06-08 DIAGNOSIS — R921 Mammographic calcification found on diagnostic imaging of breast: Secondary | ICD-10-CM | POA: Diagnosis not present

## 2016-06-08 DIAGNOSIS — D126 Benign neoplasm of colon, unspecified: Secondary | ICD-10-CM | POA: Insufficient documentation

## 2016-06-08 DIAGNOSIS — I10 Essential (primary) hypertension: Secondary | ICD-10-CM | POA: Insufficient documentation

## 2016-06-08 DIAGNOSIS — Z79899 Other long term (current) drug therapy: Secondary | ICD-10-CM

## 2016-06-08 NOTE — Progress Notes (Signed)
  Oncology Nurse Navigator Documentation  Navigator Location: [facility] (06/08/16 1500)   )Navigator Encounter Type: Clinic/MDC;Follow-up Appt (06/08/16 1500)                     Patient Visit Type: Follow-up (06/08/16 1500)    Met patient and her husband after her post surgery visit with Dr. Grayland Ormond.  States she is havng additional testing, and will get final plan of care after the results are back.  No needs at this time.  She is to call if she has any questions or needs.                          Time Spent with Patient: 15 (06/08/16 1500)

## 2016-06-14 ENCOUNTER — Ambulatory Visit (INDEPENDENT_AMBULATORY_CARE_PROVIDER_SITE_OTHER): Payer: Medicare HMO | Admitting: General Surgery

## 2016-06-14 ENCOUNTER — Encounter: Payer: Self-pay | Admitting: General Surgery

## 2016-06-14 VITALS — BP 152/82 | HR 78 | Resp 12 | Ht 66.0 in | Wt 172.0 lb

## 2016-06-14 DIAGNOSIS — C50911 Malignant neoplasm of unspecified site of right female breast: Secondary | ICD-10-CM

## 2016-06-14 DIAGNOSIS — Z170 Estrogen receptor positive status [ER+]: Secondary | ICD-10-CM

## 2016-06-14 NOTE — Patient Instructions (Signed)
The patient is aware to call back for any questions or concerns.  

## 2016-06-14 NOTE — Progress Notes (Signed)
Patient ID: Courtney Herrera, female   DOB: 1940/06/29, 76 y.o.   MRN: [id number]  Chief Complaint  Patient presents with  . Routine Post Op    HPI Courtney Herrera is a 76 y.o. female.  Here today for postoperative visit, right breast lumpectomy on 06-01-16, she states she is doing well. She states her skin is tender near bra line. Denies any gastrointestinal issues, bowels are moving regular.  HPI  Past Medical History:  Diagnosis Date  . Breast cancer ([place]) 05/11/2016   T1c,N0; ER>90%, PR neg, Her 2 neu negative by FISH. Mammoprint: High risk.   . Cancer [facility]) 2012   uterine- radiation  . GERD (gastroesophageal reflux disease)   . Hyperlipidemia     Past Surgical History:  Procedure Laterality Date  . ABDOMINAL HYSTERECTOMY  2012  . BREAST BIOPSY Left    neg  . BREAST BIOPSY Right 05/11/2016   INVASIVE MAMMARY CARCINOMA  . BREAST BIOPSY Bilateral 05/26/2016   bilat stereo path pending  . BREAST CYST ASPIRATION Left    neg  . BREAST EXCISIONAL BIOPSY Right 06/01/2016   + lumpectomy  . BREAST LUMPECTOMY WITH SENTINEL LYMPH NODE BIOPSY Right 06/01/2016   Procedure: BREAST LUMPECTOMY WITH SENTINEL LYMPH NODE BX;  Surgeon: Robert Bellow, MD;  Location: [facility] ORS;  Service: General;  Laterality: Right;    Family History  Problem Relation Age of Onset  . Breast cancer Sister 36  . Cancer Brother     brain  . Cancer Sister   . Non-Hodgkin's lymphoma Brother     Social History Social History  Substance Use Topics  . Smoking status: Never Smoker  . Smokeless tobacco: Never Used  . Alcohol use No    Allergies  Allergen Reactions  . Sulphadimidine [Sulfamethazine] Swelling  . Petrolatum-Zinc Oxide Swelling    Current Outpatient Prescriptions  Medication Sig Dispense Refill  . acetaminophen (TYLENOL) 500 MG tablet Take 1,000 mg by mouth every 6 (six) hours as needed for mild pain.    Marland Kitchen aspirin 81 MG chewable tablet Chew 81 mg by mouth at bedtime.     Marland Kitchen atorvastatin  (LIPITOR) 80 MG tablet Take 80 mg by mouth daily at 6 PM.     . furosemide (LASIX) 20 MG tablet Take 20 mg by mouth daily as needed for fluid.     . pantoprazole (PROTONIX) 40 MG tablet Take 40 mg by mouth every morning.     . niacin (NIASPAN) 500 MG CR tablet Take 1 mg by mouth at bedtime.      No current facility-administered medications for this visit.     Review of Systems Review of Systems  Constitutional: Negative.   Respiratory: Negative.   Cardiovascular: Negative.     Blood pressure (!) 152/82, pulse 78, resp. rate 12, height 5\' 6"  (1.676 m), weight 172 lb (78 kg).  Physical Exam Physical Exam  Constitutional: She is oriented to person, place, and time. She appears well-developed and well-nourished.  Pulmonary/Chest:    Right breast incision healing well.   Multiple hemangiomas present on the upper abdomen. .  Neurological: She is alert and oriented to person, place, and time.  Skin: Skin is warm and dry.  Psychiatric: Her behavior is normal.    Data Reviewed Mammoprint received after patient had left the office: Luminal type. High risk.   Assessment    Doing well s/p wide excision, SLN biopsy.  Candidate for adjuvant chemotherapy followed by whole breast radiation.  Plan    Attempted to reach patient multiple times by phone to review Mammoprint results. Message left to call in AM to review.        Follow up in one month.    This information has been scribed by Karie Fetch RN, BSN,BC.   Robert Bellow 06/14/2016, 8:14 PM

## 2016-06-16 ENCOUNTER — Encounter: Payer: Self-pay | Admitting: General Surgery

## 2016-06-20 NOTE — Progress Notes (Signed)
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 High risk Oncotype with recurrence score of 44.  INTERVAL HISTORY: Patient returns to clinic today to discuss her pathology results and treatment planning. She tolerated her lumpectomy well without significant side effects. Currently, she is anxious but otherwise feels well. She has no neurologic complaints. She denies any recent fevers or illnesses. She has a good appetite and denies weight loss. She denies any pain. She has no chest pain or shortness of breath. She denies any nausea, vomiting, constipation, or diarrhea. She has no urinary complaints. Patient offers no further specific complaints today.  REVIEW OF SYSTEMS:   Review of Systems  Constitutional: Negative.  Negative for fever, malaise/fatigue and weight loss.  Respiratory: Negative.  Negative for cough and shortness of breath.   Cardiovascular: Negative.  Negative for chest pain and leg swelling.  Gastrointestinal: Negative.  Negative for abdominal pain.  Genitourinary: Negative.   Musculoskeletal: Negative.   Neurological: Negative.  Negative for weakness.  Psychiatric/Behavioral: The patient is nervous/anxious.     As per HPI. Otherwise, a complete review of systems is negative.  PAST MEDICAL HISTORY: Past Medical History:  Diagnosis Date  . Breast cancer ([place]) 05/11/2016   T1c,N0; ER>90%, PR neg, Her 2 neu negative by FISH. Mammoprint: High risk.   . Cancer [facility]) 2012   uterine- radiation  . GERD (gastroesophageal reflux disease)   . Hyperlipidemia     PAST  SURGICAL HISTORY: Past Surgical History:  Procedure Laterality Date  . ABDOMINAL HYSTERECTOMY  2012  . BREAST BIOPSY Left    neg  . BREAST BIOPSY Right 05/11/2016   INVASIVE MAMMARY CARCINOMA  . BREAST BIOPSY Bilateral 05/26/2016   bilat stereo path pending  . BREAST CYST ASPIRATION Left    neg  . BREAST EXCISIONAL BIOPSY Right 06/01/2016   + lumpectomy  . BREAST LUMPECTOMY WITH SENTINEL LYMPH NODE BIOPSY Right 06/01/2016   Procedure: BREAST LUMPECTOMY WITH SENTINEL LYMPH NODE BX;  Surgeon: Robert Bellow, MD;  Location: [facility] ORS;  Service: General;  Laterality: Right;    FAMILY HISTORY: Family History  Problem Relation Age of Onset  . Breast cancer Sister 74  . Cancer Brother     brain  . Cancer Sister   . Non-Hodgkin's lymphoma Brother     ADVANCED DIRECTIVES (Y/N):  N  HEALTH MAINTENANCE: Social History  Substance Use Topics  . Smoking status: Never Smoker  . Smokeless tobacco: Never Used  . Alcohol use No     Colonoscopy:  PAP:  Bone density:  Lipid panel:  Allergies  Allergen Reactions  . Sulphadimidine [Sulfamethazine] Swelling  . Petrolatum-Zinc Oxide Swelling    Current Outpatient Prescriptions  Medication Sig Dispense Refill  . acetaminophen (TYLENOL) 500 MG tablet Take 1,000 mg by mouth every 6 (six) hours as needed for mild pain.    Marland Kitchen aspirin 81 MG chewable tablet Chew 81 mg by mouth at bedtime.     Marland Kitchen atorvastatin (LIPITOR) 80 MG tablet Take 80 mg by mouth daily at 6 PM.     . furosemide (LASIX) 20 MG tablet Take 20 mg by mouth daily as needed  for fluid.     . pantoprazole (PROTONIX) 40 MG tablet Take 40 mg by mouth every morning.     . niacin (NIASPAN) 500 MG CR tablet Take 1 mg by mouth at bedtime.     . ondansetron (ZOFRAN) 8 MG tablet Take 1 tablet (8 mg total) by mouth 2 (two) times daily as needed for refractory nausea / vomiting. 60 tablet 2  . prochlorperazine (COMPAZINE) 10 MG tablet Take 1 tablet (10 mg total) by mouth every 6 (six)  hours as needed (Nausea or vomiting). 60 tablet 2   No current facility-administered medications for this visit.     OBJECTIVE: Vitals:   06/21/16 1145  BP: (!) 150/91  Pulse: 81  Resp: 18  Temp: 98.3 F (36.8 C)     Body mass index is 27.61 kg/m.    ECOG FS:0 - Asymptomatic  General: Well-developed, well-nourished, no acute distress. Eyes: Pink conjunctiva, anicteric sclera. Breasts: Well healing surgical scar on right breast. Exam deferred today. Lungs: Clear to auscultation bilaterally. Heart: Regular rate and rhythm. No rubs, murmurs, or gallops. Abdomen: Soft, nontender, nondistended. No organomegaly noted, normoactive bowel sounds. Musculoskeletal: No edema, cyanosis, or clubbing. Neuro: Alert, answering all questions appropriately. Cranial nerves grossly intact. Skin: No rashes or petechiae noted. Psych: Normal affect.   LAB RESULTS:  Lab Results  Component Value Date   NA 141 05/29/2016   K 4.0 05/29/2016   CL 106 05/29/2016   CO2 29 05/29/2016   GLUCOSE 100 (H) 05/29/2016   BUN 24 (H) 05/29/2016   CREATININE 0.97 05/29/2016   CALCIUM 8.9 05/29/2016   PROT 7.8 07/08/2012   ALBUMIN 4.2 07/08/2012   AST 32 07/08/2012   ALT 24 07/08/2012   ALKPHOS 70 07/08/2012   BILITOT 0.4 07/08/2012   GFRNONAA 56 (L) 05/29/2016   GFRAA >60 05/29/2016    Lab Results  Component Value Date   WBC 7.2 07/08/2012   HGB 12.9 07/08/2012   HCT 39.6 07/08/2012   MCV 88 07/08/2012   PLT 166 07/08/2012     STUDIES: Nm Sentinel Node Injection  Result Date: 06/01/2016 CLINICAL DATA:  Right breast cancer. EXAM: NUCLEAR MEDICINE BREAST LYMPHOSCINTIGRAPHY TECHNIQUE: Intradermal injection of radiopharmaceutical was performed at the 12 o'clock, 3 o'clock, 6 o'clock, and 9 o'clock positions around the right nipple. The patient was then sent to the operating room where the sentinel node(s) were identified and removed by the surgeon. RADIOPHARMACEUTICALS:  Total of 1 mCi  Millipore-filtered Technetium-24msulfur colloid, injected in four aliquots of 0.25 mCi each. IMPRESSION: Uncomplicated intradermal injection of a total of 1 mCi Technetium-983mulfur colloid for purposes of sentinel node identification. Electronically Signed   By: JoSandi Mariscal.D.   On: 06/01/2016 10:15   UsKoreareast Complete Uni Right Inc Axilla  Result Date: 06/15/2016 No images, no charge.  Mm Clip Placement Left  Addendum Date: 05/30/2016   ADDENDUM REPORT: 05/30/2016 15:24 ADDENDUM: Pathology of the right breast biopsy revealed RIGHT BREAST, OUTER; BIOPSY: MINUTE FOCUS OF ATYPICAL DUCTAL HYPERPLASIA, 1 MM. MICROCALCIFICATIONS IN BENIGN BREAST TISSUE. This was found to be concordant by Dr. TuRadford PaxRecommendation: Surgical excision. Follow-up with Dr. ByBary Castillaor recently diagnosed right breast cancer. The patient had a preoperative appointment with Dr. ByBary Castillan 05/29/16. Results were discussed with the patient by Dr. ByBary CastillaPer the patient, she is scheduled for right breast surgery on 06/01/16. The patient [facility] on 05/30/16 for a post biopsy site check. The patient  stated she had some mild bleeding on the day following the biopsy. She stated she is doing well now with no bleeding, bruising, or hematoma. Post biopsy instructions were reviewed with the patient. All of her questions were answered. She was encouraged to call the [facility] with any further questions or concerns. Addendum by Jetta Lout, RRA on 05/30/16. Electronically Signed   By: Curlene Dolphin M.D.   On: 05/30/2016 15:24   Result Date: 05/30/2016 CLINICAL DATA:  Recent diagnosis of right breast cancer following ultrasound-guided biopsy of a right breast mass. Separate from the mass, there are clustered calcifications in the deep upper outer right breast for which biopsy is recommended. Calcifications are in the same quadrant as the biopsied mass. EXAM: RIGHT BREAST  STEREOTACTIC CORE NEEDLE BIOPSY COMPARISON:  Previous exams. FINDINGS: The patient and I discussed the procedure of stereotactic-guided biopsy including benefits and alternatives. We discussed the high likelihood of a successful procedure. We discussed the risks of the procedure including infection, bleeding, tissue injury, clip migration, and inadequate sampling. Informed written consent was given. The usual time out protocol was performed immediately prior to the procedure. Using sterile technique and 1% Lidocaine as local anesthetic, under stereotactic guidance, a 9 gauge vacuum assisted device was used to perform core needle biopsy of calcifications in the upper-outer quadrant of the right breast using a superior to inferior approach. Specimen radiograph was performed showing calcifications. Specimens with calcifications are identified for pathology. At the conclusion of the procedure, a top hat shaped tissue marker clip was deployed into the biopsy cavity. Follow-up 2-view mammogram was performed and dictated separately. IMPRESSION: Stereotactic-guided biopsy of the right breast. No apparent complications. Electronically Signed: By: Curlene Dolphin M.D. On: 05/26/2016 14:33   Mm Clip Placement Right  Result Date: 05/26/2016 CLINICAL DATA:  Bilateral stereotactic biopsies were performed today of calcifications in the upper-outer quadrant of the left breast and upper-outer quadrant of the right breast. The patient was recently diagnosed with right breast cancer. EXAM: DIAGNOSTIC BILATERAL MAMMOGRAM POST STEREOTACTIC BIOPSY COMPARISON:  Previous exam(s). FINDINGS: Mammographic images were obtained following stereotactic guided biopsy of calcifications in the upper-outer quadrant of the right breast and following stereotactic guided biopsy of calcifications in the upper-outer quadrant of the left breast. A top hat shaped biopsy clip is satisfactorily positioned in the deep upper outer quadrant of the right breast  at the site of the biopsied calcifications. This biopsy clip is approximately 2.8 cm posterior, superior, and lateral to the mass that was biopsied under ultrasound guidance. A cylindrical shaped biopsy clip is satisfactorily positioned in the posterior third of the upper outer quadrant of the left breast at the site of the biopsied calcifications. IMPRESSION: Satisfactory position of top hat shaped right breast biopsy clip and satisfactory position of cylindrical left breast biopsy clip. Final Assessment: Post Procedure Mammograms for Marker Placement Electronically Signed   By: Curlene Dolphin M.D.   On: 05/26/2016 14:39   Mm Lt Breast Bx W Loc Dev 1st Lesion Image Bx Spec Stereo Guide  Addendum Date: 05/30/2016   ADDENDUM REPORT: 05/30/2016 15:19 ADDENDUM: Pathology of the left breast biopsy revealed LEFT BREAST, OUTER; BIOPSY: COLUMNAR CELL CHANGE AND MICROCYSTS WITH ASSOCIATED MICROCALCIFICATIONS. Note: Results were called to Dr. Bary Castilla at 11 AM on 05/29/2016. This was found to be concordant by Dr. Radford Pax. Recommendation: Follow-up with Dr. Bary Castilla for recently diagnosed RIGHT breast cancer. The patient had a preoperative appointment with Dr. Bary Castilla on 05/29/16. Results were discussed with the patient  at that time by Dr. Bary Castilla. The patient was contacted by phone on 05/30/16 for a post biopsy check-up by Jetta Lout, [facility]). The patient stated she had some mild bleeding on the day following the biopsy. She stated she is fine now with no bleeding, bruising, or hematoma. Post biopsy instructions were discussed with the patient. All of her questions were answered. She was encouraged to call the [facility] with any further questions or concerns. Addendum by Jetta Lout, RRA on 05/30/16. Electronically Signed   By: Curlene Dolphin M.D.   On: 05/30/2016 15:19   Result Date: 05/30/2016 CLINICAL DATA:  76 year old patient recently diagnosed with right breast cancer. Biopsy was  suggested of a group of calcifications in the posterior upper outer left breast. EXAM: LEFT BREAST STEREOTACTIC CORE NEEDLE BIOPSY COMPARISON:  Previous exams. FINDINGS: The patient and I discussed the procedure of stereotactic-guided biopsy including benefits and alternatives. We discussed the high likelihood of a successful procedure. We discussed the risks of the procedure including infection, bleeding, tissue injury, clip migration, and inadequate sampling. Informed written consent was given. The usual time out protocol was performed immediately prior to the procedure. Using sterile technique and 1% Lidocaine as local anesthetic, under stereotactic guidance, a 9 gauge vacuum assist device was used to perform core needle biopsy of calcifications in the upper-outer quadrant of the left breast using a superior to inferior approach. Specimen radiograph was performed showing several calcifications. Specimens with calcifications are identified for pathology. At the conclusion of the procedure, a cylindrical tissue marker clip was deployed into the biopsy cavity. Follow-up 2-view mammogram was performed and dictated separately. IMPRESSION: Stereotactic-guided biopsy of the left breast. No apparent complications. Electronically Signed: By: Curlene Dolphin M.D. On: 05/26/2016 14:36   Mm Rt Breast Bx W Loc Dev 1st Lesion Image Bx Spec Stereo Guide  Addendum Date: 05/30/2016   ADDENDUM REPORT: 05/30/2016 15:24 ADDENDUM: Pathology of the right breast biopsy revealed RIGHT BREAST, OUTER; BIOPSY: MINUTE FOCUS OF ATYPICAL DUCTAL HYPERPLASIA, 1 MM. MICROCALCIFICATIONS IN BENIGN BREAST TISSUE. This was found to be concordant by Dr. Radford Pax. Recommendation: Surgical excision. Follow-up with Dr. Bary Castilla for recently diagnosed right breast cancer. The patient had a preoperative appointment with Dr. Bary Castilla on 05/29/16. Results were discussed with the patient by Dr. Bary Castilla. Per the patient, she is scheduled for right breast surgery  on 06/01/16. The patient was contacted by Jetta Lout, [facility]) on 05/30/16 for a post biopsy site check. The patient stated she had some mild bleeding on the day following the biopsy. She stated she is doing well now with no bleeding, bruising, or hematoma. Post biopsy instructions were reviewed with the patient. All of her questions were answered. She was encouraged to call the [facility] with any further questions or concerns. Addendum by Jetta Lout, RRA on 05/30/16. Electronically Signed   By: Curlene Dolphin M.D.   On: 05/30/2016 15:24   Result Date: 05/30/2016 CLINICAL DATA:  Recent diagnosis of right breast cancer following ultrasound-guided biopsy of a right breast mass. Separate from the mass, there are clustered calcifications in the deep upper outer right breast for which biopsy is recommended. Calcifications are in the same quadrant as the biopsied mass. EXAM: RIGHT BREAST STEREOTACTIC CORE NEEDLE BIOPSY COMPARISON:  Previous exams. FINDINGS: The patient and I discussed the procedure of stereotactic-guided biopsy including benefits and alternatives. We discussed the high likelihood of a successful procedure. We discussed the risks of the procedure including infection, bleeding,  tissue injury, clip migration, and inadequate sampling. Informed written consent was given. The usual time out protocol was performed immediately prior to the procedure. Using sterile technique and 1% Lidocaine as local anesthetic, under stereotactic guidance, a 9 gauge vacuum assisted device was used to perform core needle biopsy of calcifications in the upper-outer quadrant of the right breast using a superior to inferior approach. Specimen radiograph was performed showing calcifications. Specimens with calcifications are identified for pathology. At the conclusion of the procedure, a top hat shaped tissue marker clip was deployed into the biopsy cavity. Follow-up 2-view mammogram was performed and  dictated separately. IMPRESSION: Stereotactic-guided biopsy of the right breast. No apparent complications. Electronically Signed: By: Curlene Dolphin M.D. 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 High risk Oncotype with recurrence score of 44.   PLAN:    1. 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 Her 10 year risk of distant recurrence with tamoxifen alone is approximately 30%, therefore have recommended adjuvant chemotherapy using Taxotere and Cytoxan. Since patient had lumpectomy, she still will require adjuvant XRT which will completed at the conclusion of her chemotherapy. She will also benefit from an aromatase inhibitor for 5 years given the ER positivity of her tumor. Return to clinic on July 04, 2016 for further evaluation and consideration of cycle 1 of 4 of Taxotere and Cytoxan.   Approximately 30 minutes was spent in discussion of which greater than 50% was consultation.  Patient expressed understanding and was in agreement with this plan. She also understands that She can call clinic at any time with any questions, concerns, or complaints.   Cancer Staging Primary cancer of upper outer quadrant of right female breast ([place]) HER 2 NEGATIVE  (FISH) Staging form: Breast, AJCC 8th Edition - Pathologic stage from 06/12/2016: Stage IA (pT1c, pN0, cM0, G2, ER: Positive, PR: Negative, HER2: Negative) - Signed by Lloyd Huger, MD on 06/12/2016   Lloyd Huger, MD   06/25/2016 10:09 AM

## 2016-06-21 ENCOUNTER — Inpatient Hospital Stay: Payer: Medicare HMO | Attending: Oncology | Admitting: Oncology

## 2016-06-21 ENCOUNTER — Encounter: Payer: Self-pay | Admitting: General Surgery

## 2016-06-21 VITALS — BP 150/91 | HR 81 | Temp 98.3°F | Resp 18 | Wt 171.1 lb

## 2016-06-21 DIAGNOSIS — R634 Abnormal weight loss: Secondary | ICD-10-CM | POA: Insufficient documentation

## 2016-06-21 DIAGNOSIS — K1379 Other lesions of oral mucosa: Secondary | ICD-10-CM | POA: Insufficient documentation

## 2016-06-21 DIAGNOSIS — Z79899 Other long term (current) drug therapy: Secondary | ICD-10-CM | POA: Diagnosis not present

## 2016-06-21 DIAGNOSIS — Z170 Estrogen receptor positive status [ER+]: Secondary | ICD-10-CM

## 2016-06-21 DIAGNOSIS — Z7982 Long term (current) use of aspirin: Secondary | ICD-10-CM

## 2016-06-21 DIAGNOSIS — D701 Agranulocytosis secondary to cancer chemotherapy: Secondary | ICD-10-CM | POA: Diagnosis not present

## 2016-06-21 DIAGNOSIS — Z808 Family history of malignant neoplasm of other organs or systems: Secondary | ICD-10-CM | POA: Diagnosis not present

## 2016-06-21 DIAGNOSIS — K219 Gastro-esophageal reflux disease without esophagitis: Secondary | ICD-10-CM | POA: Diagnosis not present

## 2016-06-21 DIAGNOSIS — F419 Anxiety disorder, unspecified: Secondary | ICD-10-CM | POA: Diagnosis not present

## 2016-06-21 DIAGNOSIS — Z807 Family history of other malignant neoplasms of lymphoid, hematopoietic and related tissues: Secondary | ICD-10-CM | POA: Diagnosis not present

## 2016-06-21 DIAGNOSIS — R5383 Other fatigue: Secondary | ICD-10-CM | POA: Diagnosis not present

## 2016-06-21 DIAGNOSIS — Z5111 Encounter for antineoplastic chemotherapy: Secondary | ICD-10-CM | POA: Diagnosis present

## 2016-06-21 DIAGNOSIS — Z803 Family history of malignant neoplasm of breast: Secondary | ICD-10-CM | POA: Diagnosis not present

## 2016-06-21 DIAGNOSIS — E785 Hyperlipidemia, unspecified: Secondary | ICD-10-CM

## 2016-06-21 DIAGNOSIS — R5381 Other malaise: Secondary | ICD-10-CM | POA: Insufficient documentation

## 2016-06-21 DIAGNOSIS — T451X5S Adverse effect of antineoplastic and immunosuppressive drugs, sequela: Secondary | ICD-10-CM | POA: Insufficient documentation

## 2016-06-21 DIAGNOSIS — M898X9 Other specified disorders of bone, unspecified site: Secondary | ICD-10-CM | POA: Diagnosis not present

## 2016-06-21 DIAGNOSIS — C50411 Malignant neoplasm of upper-outer quadrant of right female breast: Secondary | ICD-10-CM | POA: Insufficient documentation

## 2016-06-21 NOTE — Progress Notes (Signed)
Having mild soreness in right breast from recent lumpectomy.

## 2016-06-22 ENCOUNTER — Ambulatory Visit: Payer: Medicare HMO | Admitting: Radiation Oncology

## 2016-06-22 NOTE — Patient Instructions (Signed)

## 2016-06-25 MED ORDER — PROCHLORPERAZINE MALEATE 10 MG PO TABS: 10.0000 mg | ORAL_TABLET | Freq: Four times a day (QID) | ORAL | 2 refills | Status: DC | PRN

## 2016-06-25 MED ORDER — ONDANSETRON HCL 8 MG PO TABS: 8.0000 mg | ORAL_TABLET | Freq: Two times a day (BID) | ORAL | 2 refills | Status: DC | PRN

## 2016-06-25 NOTE — Progress Notes (Signed)
START ON PATHWAY REGIMEN - Breast     A cycle is every 21 days:     Docetaxel      Cyclophosphamide   **Always confirm dose/schedule in your pharmacy ordering system**    Patient Characteristics: Adjuvant Therapy, Node Negative, HER2/neu Negative/Unknown/Equivocal, ER Positive, Oncotype High Risk (>= 31) AJCC Stage Grouping: IA Current Disease Status: No Distant Mets or Local Recurrence AJCC M Stage: 0 ER Status: Positive (+) AJCC N Stage: 0 AJCC T Stage: 1c HER2/neu: Negative (-) PR Status: Negative (-) Node Status: Negative (-) Has this patient completed genomic testing? Yes - Oncotype DX(R) Oncotype Recurrence Score: 44  Intent of Therapy: Curative Intent, Discussed with Patient

## 2016-06-27 ENCOUNTER — Inpatient Hospital Stay: Payer: Medicare HMO

## 2016-07-02 DIAGNOSIS — Z7189 Other specified counseling: Secondary | ICD-10-CM | POA: Insufficient documentation

## 2016-07-02 NOTE — Progress Notes (Signed)
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 High risk Oncotype with recurrence score of 44.  INTERVAL HISTORY: Patient returns to clinic today of Taxotere and Cytoxan. She continues to be anxious, but otherwise feels well. She has no neurologic complaints. She denies any recent fevers or illnesses. She has a good appetite and denies weight loss. She denies any pain. She has no chest pain or shortness of breath. She denies any nausea, vomiting, constipation, or diarrhea. She has no urinary complaints. Patient offers no further specific complaints today.  REVIEW OF SYSTEMS:   Review of Systems  Constitutional: Negative.  Negative for fever, malaise/fatigue and weight loss.  Respiratory: Negative.  Negative for cough and shortness of breath.   Cardiovascular: Negative.  Negative for chest pain and leg swelling.  Gastrointestinal: Negative.  Negative for abdominal pain.  Genitourinary: Negative.   Musculoskeletal: Negative.   Neurological: Negative.  Negative for weakness.  Psychiatric/Behavioral: The patient is nervous/anxious.     As per HPI. Otherwise, a complete review of systems is negative.  PAST MEDICAL HISTORY: Past Medical History:  Diagnosis Date  . Breast cancer ([place]) 05/11/2016   T1c,N0; ER>90%, PR neg, Her 2 neu negative by FISH. Mammoprint: High risk.   . Cancer [facility]) 2012   uterine- radiation  . GERD (gastroesophageal reflux disease)   . Hyperlipidemia     PAST SURGICAL HISTORY: Past Surgical History:  Procedure Laterality Date  . ABDOMINAL HYSTERECTOMY  2012   . BREAST BIOPSY Left    neg  . BREAST BIOPSY Right 05/11/2016   INVASIVE MAMMARY CARCINOMA  . BREAST BIOPSY Bilateral 05/26/2016   bilat stereo path pending  . BREAST CYST ASPIRATION Left    neg  . BREAST EXCISIONAL BIOPSY Right 06/01/2016   + lumpectomy  . BREAST LUMPECTOMY WITH SENTINEL LYMPH NODE BIOPSY Right 06/01/2016   Procedure: BREAST LUMPECTOMY WITH SENTINEL LYMPH NODE BX;  Surgeon: Robert Bellow, MD;  Location: [facility] ORS;  Service: General;  Laterality: Right;    FAMILY HISTORY: Family History  Problem Relation Age of Onset  . Breast cancer Sister 73  . Cancer Brother     brain  . Cancer Sister   . Non-Hodgkin's lymphoma Brother     ADVANCED DIRECTIVES (Y/N):  N  HEALTH MAINTENANCE: Social History  Substance Use Topics  . Smoking status: Never Smoker  . Smokeless tobacco: Never Used  . Alcohol use No     Colonoscopy:  PAP:  Bone density:  Lipid panel:  Allergies  Allergen Reactions  . Sulphadimidine [Sulfamethazine] Swelling  . Petrolatum-Zinc Oxide Swelling    Current Outpatient Prescriptions  Medication Sig Dispense Refill  . acetaminophen (TYLENOL) 500 MG tablet Take 1,000 mg by mouth every 6 (six) hours as needed for mild pain.    Marland Kitchen aspirin 81 MG chewable tablet Chew 81 mg by mouth at bedtime.     . furosemide (LASIX) 20 MG tablet Take 20 mg by mouth daily as needed for fluid.     Marland Kitchen ondansetron (ZOFRAN) 8 MG tablet Take 1 tablet (8 mg total) by mouth 2 (two) times daily as needed for refractory nausea / vomiting.  60 tablet 2  . pantoprazole (PROTONIX) 40 MG tablet Take 40 mg by mouth every morning.     . prochlorperazine (COMPAZINE) 10 MG tablet Take 1 tablet (10 mg total) by mouth every 6 (six) hours as needed (Nausea or vomiting). 60 tablet 2  . atorvastatin (LIPITOR) 80 MG tablet Take 80 mg by mouth daily at 6 PM.     . niacin (NIASPAN) 500 MG CR tablet Take 1 mg by mouth at bedtime.      No current facility-administered medications for  this visit.     OBJECTIVE: Vitals:   07/04/16 0923  BP: (!) 165/83  Pulse: 80  Resp: 18  Temp: (!) 96.5 F (35.8 C)     Body mass index is 27.76 kg/m.    ECOG FS:0 - Asymptomatic  General: Well-developed, well-nourished, no acute distress. Eyes: Pink conjunctiva, anicteric sclera. Breasts: Well healing surgical scar on right breast. Exam deferred today. Lungs: Clear to auscultation bilaterally. Heart: Regular rate and rhythm. No rubs, murmurs, or gallops. Abdomen: Soft, nontender, nondistended. No organomegaly noted, normoactive bowel sounds. Musculoskeletal: No edema, cyanosis, or clubbing. Neuro: Alert, answering all questions appropriately. Cranial nerves grossly intact. Skin: No rashes or petechiae noted. Psych: Normal affect.   LAB RESULTS:  Lab Results  Component Value Date   NA 140 07/04/2016   K 3.7 07/04/2016   CL 106 07/04/2016   CO2 29 07/04/2016   GLUCOSE 153 (H) 07/04/2016   BUN 26 (H) 07/04/2016   CREATININE 0.75 07/04/2016   CALCIUM 9.0 07/04/2016   PROT 6.9 07/04/2016   ALBUMIN 4.0 07/04/2016   AST 22 07/04/2016   ALT 41 07/04/2016   ALKPHOS 57 07/04/2016   BILITOT 0.6 07/04/2016   GFRNONAA >60 07/04/2016   GFRAA >60 07/04/2016    Lab Results  Component Value Date   WBC 4.6 07/04/2016   NEUTROABS 2.8 07/04/2016   HGB 12.2 07/04/2016   HCT 35.6 07/04/2016   MCV 87.4 07/04/2016   PLT 156 07/04/2016     STUDIES: No results found.  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 High risk Oncotype with recurrence score of 44.   PLAN:    1. 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 Her 10 year risk of distant recurrence with tamoxifen alone is approximately 30%, therefore  have recommended adjuvant chemotherapy using Taxotere and Cytoxan. Since patient had lumpectomy, she still will require adjuvant XRT which will completed at the conclusion of her chemotherapy. She will also benefit from an aromatase inhibitor for 5 years given the ER positivity of her tumor. Proceed with cycle 1 of 4 of Taxotere and Cytoxan today. Return to clinic in 1 week for laboratory work and further evaluation and then again in 3 weeks for consideration of cycle 2.  Approximately 30 minutes was spent in discussion of which greater than 50% was consultation.  Patient expressed understanding and was in agreement with this plan. She also understands that She can call clinic at any time with any questions, concerns, or complaints.   Cancer Staging Primary cancer of upper outer quadrant of right female breast ([place]) HER 2 NEGATIVE  (FISH) Staging form: Breast, AJCC 8th Edition - Pathologic stage from 06/12/2016: Stage IA (pT1c, pN0, cM0, G2, ER: Positive,  PR: Negative, HER2: Negative) - Signed by Lloyd Huger, MD on 06/12/2016   Lloyd Huger, MD   07/09/2016 8:44 AM

## 2016-07-04 ENCOUNTER — Inpatient Hospital Stay (HOSPITAL_BASED_OUTPATIENT_CLINIC_OR_DEPARTMENT_OTHER): Payer: Medicare HMO | Admitting: Oncology

## 2016-07-04 ENCOUNTER — Encounter: Payer: Self-pay | Admitting: Oncology

## 2016-07-04 ENCOUNTER — Inpatient Hospital Stay: Payer: Medicare HMO

## 2016-07-04 ENCOUNTER — Encounter: Payer: Self-pay | Admitting: *Deleted

## 2016-07-04 VITALS — BP 159/91 | HR 78

## 2016-07-04 VITALS — BP 165/83 | HR 80 | Temp 96.5°F | Resp 18 | Ht 66.0 in | Wt 172.0 lb

## 2016-07-04 DIAGNOSIS — Z803 Family history of malignant neoplasm of breast: Secondary | ICD-10-CM

## 2016-07-04 DIAGNOSIS — Z7982 Long term (current) use of aspirin: Secondary | ICD-10-CM

## 2016-07-04 DIAGNOSIS — Z79899 Other long term (current) drug therapy: Secondary | ICD-10-CM | POA: Diagnosis not present

## 2016-07-04 DIAGNOSIS — Z808 Family history of malignant neoplasm of other organs or systems: Secondary | ICD-10-CM

## 2016-07-04 DIAGNOSIS — C50411 Malignant neoplasm of upper-outer quadrant of right female breast: Secondary | ICD-10-CM

## 2016-07-04 DIAGNOSIS — F419 Anxiety disorder, unspecified: Secondary | ICD-10-CM

## 2016-07-04 DIAGNOSIS — Z807 Family history of other malignant neoplasms of lymphoid, hematopoietic and related tissues: Secondary | ICD-10-CM | POA: Diagnosis not present

## 2016-07-04 DIAGNOSIS — K219 Gastro-esophageal reflux disease without esophagitis: Secondary | ICD-10-CM | POA: Diagnosis not present

## 2016-07-04 DIAGNOSIS — E785 Hyperlipidemia, unspecified: Secondary | ICD-10-CM

## 2016-07-04 DIAGNOSIS — Z170 Estrogen receptor positive status [ER+]: Secondary | ICD-10-CM

## 2016-07-04 DIAGNOSIS — Z7189 Other specified counseling: Secondary | ICD-10-CM

## 2016-07-04 DIAGNOSIS — Z5111 Encounter for antineoplastic chemotherapy: Secondary | ICD-10-CM | POA: Diagnosis not present

## 2016-07-04 LAB — COMPREHENSIVE METABOLIC PANEL
ALBUMIN: 4 g/dL (ref 3.5–5.0)
ALT: 41 U/L (ref 14–54)
ANION GAP: 5 (ref 5–15)
AST: 22 U/L (ref 15–41)
Alkaline Phosphatase: 57 U/L (ref 38–126)
BILIRUBIN TOTAL: 0.6 mg/dL (ref 0.3–1.2)
BUN: 26 mg/dL — ABNORMAL HIGH (ref 6–20)
CO2: 29 mmol/L (ref 22–32)
Calcium: 9 mg/dL (ref 8.9–10.3)
Chloride: 106 mmol/L (ref 101–111)
Creatinine, Ser: 0.75 mg/dL (ref 0.44–1.00)
GFR calc non Af Amer: >60 mL/min (ref >60)
GLUCOSE: 153 mg/dL — AB (ref 65–99)
POTASSIUM: 3.7 mmol/L (ref 3.5–5.1)
SODIUM: 140 mmol/L (ref 135–145)
TOTAL PROTEIN: 6.9 g/dL (ref 6.5–8.1)

## 2016-07-04 LAB — CBC WITH DIFFERENTIAL/PLATELET
BASOS PCT: 1 %
Basophils Absolute: 0 10*3/uL (ref 0–0.1)
EOS ABS: 0.2 10*3/uL (ref 0–0.7)
Eosinophils Relative: 4 %
HCT: 35.6 % (ref 35.0–47.0)
Hemoglobin: 12.2 g/dL (ref 12.0–16.0)
Lymphocytes Relative: 29 %
Lymphs Abs: 1.3 10*3/uL (ref 1.0–3.6)
MCH: 29.9 pg (ref 26.0–34.0)
MCHC: 34.2 g/dL (ref 32.0–36.0)
MCV: 87.4 fL (ref 80.0–100.0)
MONO ABS: 0.3 10*3/uL (ref 0.2–0.9)
MONOS PCT: 6 %
Neutro Abs: 2.8 10*3/uL (ref 1.4–6.5)
Neutrophils Relative %: 60 %
Platelets: 156 10*3/uL (ref 150–440)
RBC: 4.07 MIL/uL (ref 3.80–5.20)
RDW: 13.7 % (ref 11.5–14.5)
WBC: 4.6 10*3/uL (ref 3.6–11.0)

## 2016-07-04 MED ORDER — HEPARIN SOD (PORK) LOCK FLUSH 100 UNIT/ML IV SOLN: 500.0000 [IU] | Freq: Once | INTRAVENOUS | Status: DC | PRN

## 2016-07-04 MED ORDER — SODIUM CHLORIDE 0.9 % IV SOLN
600.0000 mg/m2 | Freq: Once | INTRAVENOUS | Status: AC
  Administered 2016-07-04: 1140 mg via INTRAVENOUS
  Filled 2016-07-04: qty 50

## 2016-07-04 MED ORDER — SODIUM CHLORIDE 0.9 % IV SOLN
Freq: Once | INTRAVENOUS | Status: AC
  Administered 2016-07-04: 11:00:00 via INTRAVENOUS
  Filled 2016-07-04: qty 1000

## 2016-07-04 MED ORDER — SODIUM CHLORIDE 0.9 % IV SOLN
75.0000 mg/m2 | Freq: Once | INTRAVENOUS | Status: AC
  Administered 2016-07-04: 140 mg via INTRAVENOUS
  Filled 2016-07-04: qty 14

## 2016-07-04 MED ORDER — PEGFILGRASTIM 6 MG/0.6ML ~~LOC~~ PSKT: 6.0000 mg | PREFILLED_SYRINGE | Freq: Once | SUBCUTANEOUS | Status: DC

## 2016-07-04 MED ORDER — SODIUM CHLORIDE 0.9 % IV SOLN: 10.0000 mg | Freq: Once | INTRAVENOUS | Status: DC

## 2016-07-04 MED ORDER — PALONOSETRON HCL INJECTION 0.25 MG/5ML
0.2500 mg | Freq: Once | INTRAVENOUS | Status: AC
  Administered 2016-07-04: 0.25 mg via INTRAVENOUS
  Filled 2016-07-04: qty 5

## 2016-07-04 MED ORDER — DEXAMETHASONE SODIUM PHOSPHATE 10 MG/ML IJ SOLN
10.0000 mg | Freq: Once | INTRAMUSCULAR | Status: AC
  Administered 2016-07-04: 10 mg via INTRAVENOUS
  Filled 2016-07-04: qty 1

## 2016-07-04 NOTE — Progress Notes (Signed)
Pt. Here for first treatment.

## 2016-07-04 NOTE — Progress Notes (Signed)
  Oncology Nurse Navigator Documentation  Navigator Location: [facility]c (07/04/16 0900)   )Navigator Encounter Type: Clinic/MDC (07/04/16 0900)                     Patient Visit Type: MedOnc (07/04/16 0900) Treatment Phase: First Chemo Tx (07/04/16 0900) Barriers/Navigation Needs: No Needs (07/04/16 0900)        Met patient and her husband prior to her first chemo treatment.  Offered support.  She is to call if she has any questions or needs.        Acuity: Level 2 (07/04/16 0900)   Acuity Level 2: Other (support) (07/04/16 0900)

## 2016-07-09 NOTE — Progress Notes (Signed)
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 High risk Oncotype with recurrence score of 44.  INTERVAL HISTORY: Patient returns to clinic today to assess her toleration of cycle 1 of Taxotere and Cytoxan. She has some mouth irritation and fatigue.  She also had some mild bone pain. She has no neurologic complaints. She denies any recent fevers or illnesses. She has a good appetite and denies weight loss. She denies any other pain. She has no chest pain or shortness of breath. She denies any nausea, vomiting, constipation, or diarrhea. She has no urinary complaints. Patient offers no further specific complaints today.  REVIEW OF SYSTEMS:   Review of Systems  Constitutional: Negative.  Negative for fever, malaise/fatigue and weight loss.  Respiratory: Negative.  Negative for cough and shortness of breath.   Cardiovascular: Negative.  Negative for chest pain and leg swelling.  Gastrointestinal: Negative.  Negative for abdominal pain.  Genitourinary: Negative.   Musculoskeletal: Negative.   Skin: Positive for rash.  Neurological: Negative.  Negative for weakness.  Psychiatric/Behavioral: The patient is nervous/anxious.     As per HPI. Otherwise, a complete review of systems is negative.  PAST MEDICAL HISTORY: Past Medical History:  Diagnosis Date  . Breast cancer ([place]) 05/11/2016   T1c,N0; ER>90%, PR neg, Her 2 neu negative by FISH. Mammoprint: High risk.   . 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 GERD (gastroesophageal reflux disease)   . Hyperlipidemia     PAST SURGICAL  HISTORY: Past Surgical History:  Procedure Laterality Date  . ABDOMINAL HYSTERECTOMY  2012  . BREAST BIOPSY Left    neg  . BREAST BIOPSY Right 05/11/2016   INVASIVE MAMMARY CARCINOMA  . BREAST BIOPSY Bilateral 05/26/2016   bilat stereo path pending  . BREAST CYST ASPIRATION Left    neg  . BREAST EXCISIONAL BIOPSY Right 06/01/2016   + lumpectomy  . BREAST LUMPECTOMY WITH SENTINEL LYMPH NODE BIOPSY Right 06/01/2016   Procedure: BREAST LUMPECTOMY WITH SENTINEL LYMPH NODE BX;  Surgeon: Robert Bellow, MD;  Location: [facility] ORS;  Service: General;  Laterality: Right;    FAMILY HISTORY: Family History  Problem Relation Age of Onset  . Breast cancer Sister 85  . Cancer Brother     brain  . Cancer Sister   . Non-Hodgkin's lymphoma Brother     ADVANCED DIRECTIVES (Y/N):  N  HEALTH MAINTENANCE: Social History  Substance Use Topics  . Smoking status: Never Smoker  . Smokeless tobacco: Never Used  . Alcohol use No     Colonoscopy:  PAP:  Bone density:  Lipid panel:  Allergies  Allergen Reactions  . Sulphadimidine [Sulfamethazine] Swelling  . Petrolatum-Zinc Oxide Swelling    Current Outpatient Prescriptions  Medication Sig Dispense Refill  . acetaminophen (TYLENOL) 500 MG tablet Take 1,000 mg by mouth every 6 (six) hours as needed for mild pain.    Marland Kitchen aspirin 81 MG chewable tablet Chew 81 mg by mouth at bedtime.     . furosemide (LASIX) 20 MG tablet Take 20 mg by mouth daily as needed for fluid.     Marland Kitchen ondansetron (ZOFRAN) 8 MG tablet  Take 1 tablet (8 mg total) by mouth 2 (two) times daily as needed for refractory nausea / vomiting. 60 tablet 2  . pantoprazole (PROTONIX) 40 MG tablet Take 40 mg by mouth every morning.     . prochlorperazine (COMPAZINE) 10 MG tablet Take 1 tablet (10 mg total) by mouth every 6 (six) hours as needed (Nausea or vomiting). 60 tablet 2  . atorvastatin (LIPITOR) 80 MG tablet Take 80 mg by mouth daily at 6 PM.     . niacin (NIASPAN) 500 MG CR  tablet Take 1 mg by mouth at bedtime.      No current facility-administered medications for this visit.     OBJECTIVE: Vitals:   07/11/16 1035  BP: (!) 145/91  Pulse: 93  Resp: 18  Temp: 98 F (36.7 C)     Body mass index is 26.95 kg/m.    ECOG FS:0 - Asymptomatic  General: Well-developed, well-nourished, no acute distress. Eyes: Pink conjunctiva, anicteric sclera. Breasts: Well healing surgical scar on right breast. Exam deferred today. Lungs: Clear to auscultation bilaterally. Heart: Regular rate and rhythm. No rubs, murmurs, or gallops. Abdomen: Soft, nontender, nondistended. No organomegaly noted, normoactive bowel sounds. Musculoskeletal: No edema, cyanosis, or clubbing. Neuro: Alert, answering all questions appropriately. Cranial nerves grossly intact. Skin: No rashes or petechiae noted. Psych: Normal affect.   LAB RESULTS:  Lab Results  Component Value Date   NA 137 07/11/2016   K 4.9 07/11/2016   CL 102 07/11/2016   CO2 30 07/11/2016   GLUCOSE 141 (H) 07/11/2016   BUN 21 (H) 07/11/2016   CREATININE 0.96 07/11/2016   CALCIUM 9.2 07/11/2016   PROT 6.9 07/11/2016   ALBUMIN 4.0 07/11/2016   AST 18 07/11/2016   ALT 23 07/11/2016   ALKPHOS 58 07/11/2016   BILITOT 1.0 07/11/2016   GFRNONAA 56 (L) 07/11/2016   GFRAA >60 07/11/2016    Lab Results  Component Value Date   WBC 0.8 (LL) 07/11/2016   NEUTROABS 0.1 (L) 07/11/2016   HGB 12.8 07/11/2016   HCT 37.5 07/11/2016   MCV 87.3 07/11/2016   PLT 141 (L) 07/11/2016     STUDIES: No results found.  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 High risk Oncotype with recurrence score of 44.   PLAN:    1. 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 Her 10 year risk of distant recurrence with tamoxifen alone is approximately 30%, therefore have recommended adjuvant chemotherapy using Taxotere and Cytoxan. Since patient had lumpectomy, she still will require adjuvant XRT which will completed at the conclusion of her chemotherapy. She will also benefit from an aromatase inhibitor for 5 years given the ER positivity of her tumor. Patient tolerated cycle 1 of 4 of Taxotere and Cytoxan last week without significant side effects. Return to clinic in 2 weeks for consideration of cycle 2. 2. Neutropenia:  Will add Onpro Neulasta for the remainder of the patient's treatments. 3. Mouth pain: Recommended gargling with baking soda and water.  Approximately 30 minutes was spent in discussion of which greater than 50% was consultation.  Patient expressed understanding and was in agreement with this plan. She also understands that She can call clinic at any time with any questions, concerns, or complaints.  Cancer Staging Primary cancer of upper outer quadrant of right female breast ([place]) HER 2 NEGATIVE  (FISH) Staging form: Breast, AJCC 8th Edition - Pathologic stage from 06/12/2016: Stage IA (pT1c, pN0, cM0, G2, ER: Positive, PR: Negative, HER2: Negative) - Signed by Lloyd Huger, MD on 06/12/2016   Lloyd Huger, MD   07/17/2016 12:26 AM

## 2016-07-11 ENCOUNTER — Inpatient Hospital Stay: Payer: Medicare HMO

## 2016-07-11 ENCOUNTER — Inpatient Hospital Stay (HOSPITAL_BASED_OUTPATIENT_CLINIC_OR_DEPARTMENT_OTHER): Payer: Medicare HMO | Admitting: Oncology

## 2016-07-11 VITALS — BP 145/91 | HR 93 | Temp 98.0°F | Resp 18 | Wt 167.0 lb

## 2016-07-11 DIAGNOSIS — R634 Abnormal weight loss: Secondary | ICD-10-CM

## 2016-07-11 DIAGNOSIS — R5381 Other malaise: Secondary | ICD-10-CM | POA: Diagnosis not present

## 2016-07-11 DIAGNOSIS — Z5111 Encounter for antineoplastic chemotherapy: Secondary | ICD-10-CM | POA: Diagnosis not present

## 2016-07-11 DIAGNOSIS — C50411 Malignant neoplasm of upper-outer quadrant of right female breast: Secondary | ICD-10-CM

## 2016-07-11 DIAGNOSIS — T451X5S Adverse effect of antineoplastic and immunosuppressive drugs, sequela: Secondary | ICD-10-CM | POA: Diagnosis not present

## 2016-07-11 DIAGNOSIS — F419 Anxiety disorder, unspecified: Secondary | ICD-10-CM

## 2016-07-11 DIAGNOSIS — Z170 Estrogen receptor positive status [ER+]: Secondary | ICD-10-CM

## 2016-07-11 DIAGNOSIS — K219 Gastro-esophageal reflux disease without esophagitis: Secondary | ICD-10-CM

## 2016-07-11 DIAGNOSIS — D701 Agranulocytosis secondary to cancer chemotherapy: Secondary | ICD-10-CM

## 2016-07-11 DIAGNOSIS — R5383 Other fatigue: Secondary | ICD-10-CM

## 2016-07-11 DIAGNOSIS — E785 Hyperlipidemia, unspecified: Secondary | ICD-10-CM

## 2016-07-11 DIAGNOSIS — K1379 Other lesions of oral mucosa: Secondary | ICD-10-CM | POA: Diagnosis not present

## 2016-07-11 DIAGNOSIS — Z79899 Other long term (current) drug therapy: Secondary | ICD-10-CM

## 2016-07-11 DIAGNOSIS — Z7982 Long term (current) use of aspirin: Secondary | ICD-10-CM

## 2016-07-11 DIAGNOSIS — M898X9 Other specified disorders of bone, unspecified site: Secondary | ICD-10-CM | POA: Diagnosis not present

## 2016-07-11 DIAGNOSIS — Z803 Family history of malignant neoplasm of breast: Secondary | ICD-10-CM

## 2016-07-11 DIAGNOSIS — Z808 Family history of malignant neoplasm of other organs or systems: Secondary | ICD-10-CM

## 2016-07-11 LAB — COMPREHENSIVE METABOLIC PANEL
ALBUMIN: 4 g/dL (ref 3.5–5.0)
ALT: 23 U/L (ref 14–54)
ANION GAP: 5 (ref 5–15)
AST: 18 U/L (ref 15–41)
Alkaline Phosphatase: 58 U/L (ref 38–126)
BILIRUBIN TOTAL: 1 mg/dL (ref 0.3–1.2)
BUN: 21 mg/dL — ABNORMAL HIGH (ref 6–20)
CHLORIDE: 102 mmol/L (ref 101–111)
CO2: 30 mmol/L (ref 22–32)
Calcium: 9.2 mg/dL (ref 8.9–10.3)
Creatinine, Ser: 0.96 mg/dL (ref 0.44–1.00)
GFR calc Af Amer: >60 mL/min (ref >60)
GFR calc non Af Amer: 56 mL/min — ABNORMAL LOW (ref >60)
GLUCOSE: 141 mg/dL — AB (ref 65–99)
POTASSIUM: 4.9 mmol/L (ref 3.5–5.1)
SODIUM: 137 mmol/L (ref 135–145)
TOTAL PROTEIN: 6.9 g/dL (ref 6.5–8.1)

## 2016-07-11 LAB — CBC WITH DIFFERENTIAL/PLATELET
BASOS PCT: 2 %
Basophils Absolute: 0 10*3/uL (ref 0–0.1)
EOS PCT: 5 %
Eosinophils Absolute: 0 10*3/uL (ref 0–0.7)
HCT: 37.5 % (ref 35.0–47.0)
Hemoglobin: 12.8 g/dL (ref 12.0–16.0)
Lymphocytes Relative: 77 %
Lymphs Abs: 0.6 10*3/uL — ABNORMAL LOW (ref 1.0–3.6)
MCH: 29.8 pg (ref 26.0–34.0)
MCHC: 34.1 g/dL (ref 32.0–36.0)
MCV: 87.3 fL (ref 80.0–100.0)
MONO ABS: 0 10*3/uL — AB (ref 0.2–0.9)
MONOS PCT: 4 %
NEUTROS PCT: 12 %
Neutro Abs: 0.1 10*3/uL — ABNORMAL LOW (ref 1.4–6.5)
PLATELETS: 141 10*3/uL — AB (ref 150–440)
RBC: 4.29 MIL/uL (ref 3.80–5.20)
RDW: 13.2 % (ref 11.5–14.5)
WBC: 0.8 10*3/uL — CL (ref 3.6–11.0)

## 2016-07-11 NOTE — Progress Notes (Signed)
Tolerated first chemo treatment well last week. Complains of small rash to lower back and bone pain. Has fatigue and mouth irritation. Pt encouraged to keep using hydrocortisone cream on rash and that can try baking soda rinse for mouth irritation. Pt instructed to call if develops mouth sores. Pt verbalized understanding.

## 2016-07-12 ENCOUNTER — Encounter: Payer: Self-pay | Admitting: General Surgery

## 2016-07-12 ENCOUNTER — Ambulatory Visit (INDEPENDENT_AMBULATORY_CARE_PROVIDER_SITE_OTHER): Payer: Medicare HMO | Admitting: General Surgery

## 2016-07-12 VITALS — BP 130/80 | HR 78 | Resp 12 | Ht 66.0 in | Wt 168.0 lb

## 2016-07-12 DIAGNOSIS — Z170 Estrogen receptor positive status [ER+]: Secondary | ICD-10-CM

## 2016-07-12 DIAGNOSIS — N6091 Unspecified benign mammary dysplasia of right breast: Secondary | ICD-10-CM

## 2016-07-12 DIAGNOSIS — C50911 Malignant neoplasm of unspecified site of right female breast: Secondary | ICD-10-CM

## 2016-07-12 NOTE — Progress Notes (Signed)
Patient ID: Courtney Herrera, female   DOB: Feb 15, 1941, 75 y.o.   MRN: [id number]  Chief Complaint  Patient presents with  . Routine Post Op    HPI Courtney Herrera is a 76 y.o. female.  Here today for postoperative visit right lumpectomy. She states she is doing well. She started chemotherapy on 07-04-16.  HPI  Past Medical History:  Diagnosis Date  . Breast cancer ([place]) 05/11/2016   T1c,N0; ER>90%, PR neg, Her 2 neu negative by FISH. Mammoprint: High risk.   . 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 GERD (gastroesophageal reflux disease)   . Hyperlipidemia     Past Surgical History:  Procedure Laterality Date  . ABDOMINAL HYSTERECTOMY  2012  . BREAST BIOPSY Left    neg  . BREAST BIOPSY Right 05/11/2016   INVASIVE MAMMARY CARCINOMA  . BREAST BIOPSY Bilateral 05/26/2016   bilat stereo path pending  . BREAST CYST ASPIRATION Left    neg  . BREAST EXCISIONAL BIOPSY Right 06/01/2016   + lumpectomy  . BREAST LUMPECTOMY WITH SENTINEL LYMPH NODE BIOPSY Right 06/01/2016   Procedure: BREAST LUMPECTOMY WITH SENTINEL LYMPH NODE BX;  Surgeon: Robert Bellow, MD;  Location: [facility] ORS;  Service: General;  Laterality: Right;    Family History  Problem Relation Age of Onset  . Breast cancer Sister 61  . Cancer Brother     brain  . Cancer Sister   . Non-Hodgkin's lymphoma Brother     Social History Social History  Substance Use Topics  . Smoking status: Never Smoker  . Smokeless tobacco: Never Used  . Alcohol use No    Allergies  Allergen Reactions  . Sulphadimidine [Sulfamethazine] Swelling  . Petrolatum-Zinc Oxide Swelling    Current Outpatient Prescriptions  Medication Sig Dispense Refill  . acetaminophen (TYLENOL) 500 MG tablet Take 1,000 mg by mouth every 6 (six) hours as needed for mild pain.    Marland Kitchen aspirin 81 MG chewable tablet Chew 81 mg by mouth at bedtime.     . furosemide (LASIX) 20 MG tablet Take 20 mg by mouth daily as needed for fluid.     Marland Kitchen ondansetron  (ZOFRAN) 8 MG tablet Take 1 tablet (8 mg total) by mouth 2 (two) times daily as needed for refractory nausea / vomiting. 60 tablet 2  . pantoprazole (PROTONIX) 40 MG tablet Take 40 mg by mouth every morning.     . prochlorperazine (COMPAZINE) 10 MG tablet Take 1 tablet (10 mg total) by mouth every 6 (six) hours as needed (Nausea or vomiting). 60 tablet 2  . atorvastatin (LIPITOR) 80 MG tablet Take 80 mg by mouth daily at 6 PM.     . niacin (NIASPAN) 500 MG CR tablet Take 1 mg by mouth at bedtime.      No current facility-administered medications for this visit.     Review of Systems Review of Systems  Constitutional: Negative.   Respiratory: Negative.   Cardiovascular: Negative.     Blood pressure 130/80, pulse 78, resp. rate 12, height 5\' 6"  (1.676 m), weight 168 lb (76.2 kg).  Physical Exam Physical Exam  Constitutional: She is oriented to person, place, and time. She appears well-developed and well-nourished.  Pulmonary/Chest:    Right breast incision is clean and healing well.   Neurological: She is alert and oriented to person, place, and time.  Skin: Skin is warm and dry.    Data Reviewed CBC completed yesterday showed profound neutropenia with a total white  blood cell count of 800, absolute neutrophil count 100.  Assessment    Surgical site doing well without evidence of ecchymosis or hematoma.  Neutropenia secondary to first chemotherapy treatment.  High risk Mammoprint score.    Plan    We'll plan for follow-up examination in 3 months.    This information has been scribed by Gaspar Cola CMA.     Robert Bellow 07/12/2016, 11:24 AM

## 2016-07-12 NOTE — Patient Instructions (Addendum)
The patient is aware to call back for any questions or concerns. Patient to return in three months.

## 2016-07-17 ENCOUNTER — Other Ambulatory Visit: Payer: Self-pay | Admitting: Oncology

## 2016-07-19 ENCOUNTER — Inpatient Hospital Stay: Payer: Medicare HMO | Attending: Oncology

## 2016-07-19 ENCOUNTER — Inpatient Hospital Stay: Payer: Medicare HMO

## 2016-07-19 ENCOUNTER — Telehealth: Payer: Self-pay | Admitting: *Deleted

## 2016-07-19 ENCOUNTER — Other Ambulatory Visit: Payer: Self-pay | Admitting: Internal Medicine

## 2016-07-19 DIAGNOSIS — T451X5S Adverse effect of antineoplastic and immunosuppressive drugs, sequela: Secondary | ICD-10-CM | POA: Insufficient documentation

## 2016-07-19 DIAGNOSIS — Z5189 Encounter for other specified aftercare: Secondary | ICD-10-CM

## 2016-07-19 DIAGNOSIS — D701 Agranulocytosis secondary to cancer chemotherapy: Secondary | ICD-10-CM | POA: Diagnosis not present

## 2016-07-19 DIAGNOSIS — K219 Gastro-esophageal reflux disease without esophagitis: Secondary | ICD-10-CM | POA: Diagnosis not present

## 2016-07-19 DIAGNOSIS — Z7982 Long term (current) use of aspirin: Secondary | ICD-10-CM | POA: Insufficient documentation

## 2016-07-19 DIAGNOSIS — T451X5A Adverse effect of antineoplastic and immunosuppressive drugs, initial encounter: Principal | ICD-10-CM

## 2016-07-19 DIAGNOSIS — E785 Hyperlipidemia, unspecified: Secondary | ICD-10-CM | POA: Insufficient documentation

## 2016-07-19 DIAGNOSIS — Z807 Family history of other malignant neoplasms of lymphoid, hematopoietic and related tissues: Secondary | ICD-10-CM | POA: Diagnosis not present

## 2016-07-19 DIAGNOSIS — Z5111 Encounter for antineoplastic chemotherapy: Secondary | ICD-10-CM | POA: Insufficient documentation

## 2016-07-19 DIAGNOSIS — Z803 Family history of malignant neoplasm of breast: Secondary | ICD-10-CM | POA: Diagnosis not present

## 2016-07-19 DIAGNOSIS — Z79899 Other long term (current) drug therapy: Secondary | ICD-10-CM | POA: Insufficient documentation

## 2016-07-19 DIAGNOSIS — R040 Epistaxis: Secondary | ICD-10-CM | POA: Diagnosis not present

## 2016-07-19 DIAGNOSIS — C50411 Malignant neoplasm of upper-outer quadrant of right female breast: Secondary | ICD-10-CM | POA: Insufficient documentation

## 2016-07-19 LAB — CBC WITH DIFFERENTIAL/PLATELET
Basophils Absolute: 0 10*3/uL (ref 0–0.1)
Basophils Relative: 3 %
Eosinophils Absolute: 0 10*3/uL (ref 0–0.7)
Eosinophils Relative: 0 %
HCT: 34.6 % — ABNORMAL LOW (ref 35.0–47.0)
Hemoglobin: 11.6 g/dL — ABNORMAL LOW (ref 12.0–16.0)
Lymphocytes Relative: 53 %
Lymphs Abs: 0.8 10*3/uL — ABNORMAL LOW (ref 1.0–3.6)
MCH: 29.5 pg (ref 26.0–34.0)
MCHC: 33.7 g/dL (ref 32.0–36.0)
MCV: 87.4 fL (ref 80.0–100.0)
Monocytes Absolute: 0.4 10*3/uL (ref 0.2–0.9)
Monocytes Relative: 29 %
Neutro Abs: 0.2 10*3/uL — ABNORMAL LOW (ref 1.4–6.5)
Neutrophils Relative %: 15 %
Platelets: 192 10*3/uL (ref 150–440)
RBC: 3.95 MIL/uL (ref 3.80–5.20)
RDW: 13 % (ref 11.5–14.5)
WBC: 1.5 10*3/uL — ABNORMAL LOW (ref 3.6–11.0)

## 2016-07-19 MED ORDER — TBO-FILGRASTIM 480 MCG/0.8ML ~~LOC~~ SOSY: 480.0000 ug | PREFILLED_SYRINGE | Freq: Every day | SUBCUTANEOUS | Status: DC

## 2016-07-19 MED ORDER — TBO-FILGRASTIM 480 MCG/0.8ML ~~LOC~~ SOSY
480.0000 ug | PREFILLED_SYRINGE | Freq: Once | SUBCUTANEOUS | Status: AC
  Administered 2016-07-19: 480 ug via SUBCUTANEOUS
  Filled 2016-07-19: qty 0.8

## 2016-07-19 MED ORDER — TBO-FILGRASTIM 480 MCG/0.8ML ~~LOC~~ SOSY: 480.0000 ug | PREFILLED_SYRINGE | Freq: Once | SUBCUTANEOUS | Status: DC

## 2016-07-19 NOTE — Telephone Encounter (Signed)
Spoke with Dr. Grayland Ormond as this is a Dr. Grayland Ormond patient. md aware of Dr. Aletha Halim recommendations and that md received the phone call regarding critical value.

## 2016-07-19 NOTE — Telephone Encounter (Signed)
10:17AM - Received phone call from [facility] in labs- [facility] 0.2-critical results. Read back process performed with tech.  64 am Dr. Rogue Bussing - on call md today- read back process performed with md.   Per Dr. Consuello Masse ordered granix injection today and tomorrow.   Neutropenic precautions discussed with patient. Pt aware of the need for granix. Advised pt to take otc claritin  Daily x 4 days s/p granix injection to avoid side effects of bone aches with granix injection

## 2016-07-19 NOTE — Telephone Encounter (Addendum)
Concerned that she had a "nose bleed" and had chemo "last month" 07/04/16 was her TC treatment. On questioning, it is more of blood when she sneezes or blows her nose. I asked her to come in for a lab check, she said she will come right over

## 2016-07-20 ENCOUNTER — Inpatient Hospital Stay: Payer: Medicare HMO

## 2016-07-20 ENCOUNTER — Telehealth: Payer: Self-pay

## 2016-07-20 NOTE — Telephone Encounter (Signed)
-----   Message from Lloyd Huger, MD sent at 07/19/2016 10:45 PM EDT ----- Madaline Brilliant, thanks.  We will cancel the injection tomorrow.   ----- Message ----- From: Aleen Sells Chrismon Sent: 07/19/2016   2:07 PM To: Lloyd Huger, MD, Myra Rayne Du, [place], #  Granix went to review.  Please call for peer-to-peer so we will make sure we get an authorization at [phone].  I have submitted clinical.  Her ID number is [id number].  Reason is that she did not have fever! No temperature was documented.  Sorry...  Thanks, Aleen Sells

## 2016-07-20 NOTE — Telephone Encounter (Signed)
Message forwarded to scheduling to cancel injection appointment today and inform patient.

## 2016-07-25 ENCOUNTER — Inpatient Hospital Stay: Payer: Medicare HMO

## 2016-07-25 ENCOUNTER — Inpatient Hospital Stay (HOSPITAL_BASED_OUTPATIENT_CLINIC_OR_DEPARTMENT_OTHER): Payer: Medicare HMO | Admitting: Hematology and Oncology

## 2016-07-25 ENCOUNTER — Encounter: Payer: Self-pay | Admitting: Hematology and Oncology

## 2016-07-25 VITALS — BP 159/86 | HR 90 | Temp 96.7°F | Resp 18 | Wt 170.4 lb

## 2016-07-25 DIAGNOSIS — K219 Gastro-esophageal reflux disease without esophagitis: Secondary | ICD-10-CM

## 2016-07-25 DIAGNOSIS — Z7982 Long term (current) use of aspirin: Secondary | ICD-10-CM

## 2016-07-25 DIAGNOSIS — E785 Hyperlipidemia, unspecified: Secondary | ICD-10-CM

## 2016-07-25 DIAGNOSIS — Z803 Family history of malignant neoplasm of breast: Secondary | ICD-10-CM

## 2016-07-25 DIAGNOSIS — C50411 Malignant neoplasm of upper-outer quadrant of right female breast: Secondary | ICD-10-CM | POA: Diagnosis not present

## 2016-07-25 DIAGNOSIS — T451X5S Adverse effect of antineoplastic and immunosuppressive drugs, sequela: Secondary | ICD-10-CM | POA: Diagnosis not present

## 2016-07-25 DIAGNOSIS — Z79899 Other long term (current) drug therapy: Secondary | ICD-10-CM

## 2016-07-25 DIAGNOSIS — Z807 Family history of other malignant neoplasms of lymphoid, hematopoietic and related tissues: Secondary | ICD-10-CM | POA: Diagnosis not present

## 2016-07-25 DIAGNOSIS — D701 Agranulocytosis secondary to cancer chemotherapy: Secondary | ICD-10-CM

## 2016-07-25 DIAGNOSIS — R040 Epistaxis: Secondary | ICD-10-CM

## 2016-07-25 DIAGNOSIS — Z5111 Encounter for antineoplastic chemotherapy: Secondary | ICD-10-CM

## 2016-07-25 LAB — CBC WITH DIFFERENTIAL/PLATELET
BASOS ABS: 0 10*3/uL (ref 0–0.1)
BASOS PCT: 1 %
EOS ABS: 0 10*3/uL (ref 0–0.7)
EOS PCT: 1 %
HCT: 36 % (ref 35.0–47.0)
Hemoglobin: 12.4 g/dL (ref 12.0–16.0)
Lymphocytes Relative: 19 %
Lymphs Abs: 1.2 10*3/uL (ref 1.0–3.6)
MCH: 29.7 pg (ref 26.0–34.0)
MCHC: 34.4 g/dL (ref 32.0–36.0)
MCV: 86.5 fL (ref 80.0–100.0)
MONO ABS: 0.5 10*3/uL (ref 0.2–0.9)
Monocytes Relative: 8 %
Neutro Abs: 4.5 10*3/uL (ref 1.4–6.5)
Neutrophils Relative %: 71 %
PLATELETS: 176 10*3/uL (ref 150–440)
RBC: 4.16 MIL/uL (ref 3.80–5.20)
RDW: 13.2 % (ref 11.5–14.5)
WBC: 6.3 10*3/uL (ref 3.6–11.0)

## 2016-07-25 LAB — COMPREHENSIVE METABOLIC PANEL
ALK PHOS: 72 U/L (ref 38–126)
ALT: 24 U/L (ref 14–54)
AST: 23 U/L (ref 15–41)
Albumin: 4 g/dL (ref 3.5–5.0)
Anion gap: 6 (ref 5–15)
BILIRUBIN TOTAL: 0.5 mg/dL (ref 0.3–1.2)
BUN: 17 mg/dL (ref 6–20)
CHLORIDE: 105 mmol/L (ref 101–111)
CO2: 29 mmol/L (ref 22–32)
CREATININE: 0.92 mg/dL (ref 0.44–1.00)
Calcium: 9.2 mg/dL (ref 8.9–10.3)
GFR calc Af Amer: >60 mL/min (ref >60)
GFR, EST NON AFRICAN AMERICAN: 59 mL/min — AB (ref >60)
Glucose, Bld: 136 mg/dL — ABNORMAL HIGH (ref 65–99)
Potassium: 4.2 mmol/L (ref 3.5–5.1)
Sodium: 140 mmol/L (ref 135–145)
Total Protein: 6.8 g/dL (ref 6.5–8.1)

## 2016-07-25 MED ORDER — SODIUM CHLORIDE 0.9 % IV SOLN
75.0000 mg/m2 | Freq: Once | INTRAVENOUS | Status: AC
  Administered 2016-07-25: 140 mg via INTRAVENOUS
  Filled 2016-07-25: qty 14

## 2016-07-25 MED ORDER — SODIUM CHLORIDE 0.9 % IV SOLN
Freq: Once | INTRAVENOUS | Status: AC
  Administered 2016-07-25: 11:00:00 via INTRAVENOUS
  Filled 2016-07-25: qty 1000

## 2016-07-25 MED ORDER — SODIUM CHLORIDE 0.9 % IV SOLN
600.0000 mg/m2 | Freq: Once | INTRAVENOUS | Status: AC
  Administered 2016-07-25: 1140 mg via INTRAVENOUS
  Filled 2016-07-25: qty 50

## 2016-07-25 MED ORDER — PEGFILGRASTIM 6 MG/0.6ML ~~LOC~~ PSKT
6.0000 mg | PREFILLED_SYRINGE | Freq: Once | SUBCUTANEOUS | Status: AC
  Administered 2016-07-25: 6 mg via SUBCUTANEOUS
  Filled 2016-07-25: qty 0.6

## 2016-07-25 MED ORDER — PALONOSETRON HCL INJECTION 0.25 MG/5ML
0.2500 mg | Freq: Once | INTRAVENOUS | Status: AC
  Administered 2016-07-25: 0.25 mg via INTRAVENOUS
  Filled 2016-07-25: qty 5

## 2016-07-25 MED ORDER — DEXAMETHASONE SODIUM PHOSPHATE 10 MG/ML IJ SOLN
10.0000 mg | Freq: Once | INTRAMUSCULAR | Status: AC
  Administered 2016-07-25: 10 mg via INTRAVENOUS
  Filled 2016-07-25: qty 1

## 2016-07-25 NOTE — Progress Notes (Signed)
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 High risk Oncotype with recurrence score of 44.  INTERVAL HISTORY: Patient returns to clinic today for assessment prior to cycle #2 of Taxotere and Cytoxan.   She received cycle #1 on 07/04/2016.  She did not receive Neulasta.  She was seen by Dr. Grayland Ormond on 07/11/2016.  She descibed fatigue, mild bone pain, and mouth irritation.  She had a rash.    Counts have been monitored.  WBC was 800 with an [place] of 100 on 07/11/2016.   CBC on 07/19/2016 revealed a WBC 1500 with an ANC of 200.  She received Granix 480 mcg on 07/19/2016.  No further Granix was approved.  During the interim, she has done well.  She denies any neuropathy.  She denies any mouth sores or tenderness. She has had no rash.  She had a nosebleed on Wednesday.   REVIEW OF SYSTEMS:   Review of Systems  Constitutional: Negative for chills, fever, malaise/fatigue and weight loss.  HENT: Positive for nosebleeds. Negative for congestion, ear pain and sore throat.   Eyes: Negative for blurred vision, double vision, discharge and redness.  Respiratory: Negative for cough, sputum production, shortness of breath and wheezing.   Cardiovascular: Negative for chest pain, palpitations, orthopnea, leg swelling and PND.  Gastrointestinal: Negative for abdominal pain, blood in stool, constipation, diarrhea, melena, nausea and vomiting.  Genitourinary: Negative for dysuria, frequency, hematuria and urgency.  Musculoskeletal: Negative for back pain, joint pain and myalgias.  Skin: Negative for  rash.  Neurological: Negative for dizziness, tingling, sensory change, focal weakness, weakness and headaches.  Psychiatric/Behavioral: Negative for depression. The patient is not nervous/anxious and does not have insomnia.     As per HPI. Otherwise, a complete review of systems is negative.  PAST MEDICAL HISTORY: Past Medical History:  Diagnosis Date  . Breast cancer ([place]) 05/11/2016   T1c,N0; ER>90%, PR neg, Her 2 neu negative by FISH. Mammoprint: High risk.   . Cancer [facility]) 2012   uterine- radiation  . GERD (gastroesophageal reflux disease)   . Hyperlipidemia     PAST SURGICAL HISTORY: Past Surgical History:  Procedure Laterality Date  . ABDOMINAL HYSTERECTOMY  2012  . BREAST BIOPSY Left    neg  . BREAST BIOPSY Right 05/11/2016   INVASIVE MAMMARY CARCINOMA  . BREAST BIOPSY Bilateral 05/26/2016   bilat stereo path pending  . BREAST CYST ASPIRATION Left    neg  . BREAST EXCISIONAL BIOPSY Right 06/01/2016   + lumpectomy  . BREAST LUMPECTOMY WITH SENTINEL LYMPH NODE BIOPSY Right 06/01/2016   Procedure: BREAST LUMPECTOMY WITH SENTINEL LYMPH NODE BX;  Surgeon: Robert Bellow, MD;  Location: [facility] ORS;  Service: General;  Laterality: Right;    FAMILY HISTORY: Family History  Problem Relation Age of Onset  . Breast cancer Sister 16  . Cancer Brother     brain  . Cancer Sister   . Non-Hodgkin's lymphoma Brother     ADVANCED DIRECTIVES (Y/N):  N  HEALTH MAINTENANCE: Social History  Substance Use Topics  . Smoking status: Never Smoker  . Smokeless tobacco: Never Used  . Alcohol use No  Colonoscopy:  PAP:  Bone density:  Lipid panel:  Allergies  Allergen Reactions  . Sulphadimidine [Sulfamethazine] Swelling  . Petrolatum-Zinc Oxide Swelling    Current Outpatient Prescriptions  Medication Sig Dispense Refill  . acetaminophen (TYLENOL) 500 MG tablet Take 1,000 mg by mouth every 6 (six) hours as needed for mild pain.    Marland Kitchen aspirin 81 MG chewable tablet  Chew 81 mg by mouth at bedtime.     Marland Kitchen atorvastatin (LIPITOR) 80 MG tablet Take 80 mg by mouth daily at 6 PM.     . furosemide (LASIX) 20 MG tablet Take 20 mg by mouth daily as needed for fluid.     . niacin (NIASPAN) 500 MG CR tablet Take 1 mg by mouth at bedtime.     . ondansetron (ZOFRAN) 8 MG tablet Take 1 tablet (8 mg total) by mouth 2 (two) times daily as needed for refractory nausea / vomiting. 60 tablet 2  . pantoprazole (PROTONIX) 40 MG tablet Take 40 mg by mouth every morning.     . prochlorperazine (COMPAZINE) 10 MG tablet Take 1 tablet (10 mg total) by mouth every 6 (six) hours as needed (Nausea or vomiting). 60 tablet 2   No current facility-administered medications for this visit.     OBJECTIVE: Vitals:   07/25/16 0915  BP: (!) 164/87  Pulse: 84  Resp: 18  Temp: (!) 96.7 F (35.9 C)     Body mass index is 27.5 kg/m.    ECOG FS:0 - Asymptomatic  GENERAL:  Well developed, well nourished, woman sitting comfortably in the exam room in no acute distress. MENTAL STATUS:  Alert and oriented to person, place and time. HEAD:  Wearing a denim cap.  Short gray hair.  Normocephalic, atraumatic, face symmetric, no Cushingoid features. EYES: Glasses.  Blue eyes.  Pupils equal round and reactive to light and accomodation.  No conjunctivitis or scleral icterus. ENT:  Oropharynx clear without lesion.  Tongue normal. Mucous membranes moist.  RESPIRATORY:  Clear to auscultation without rales, wheezes or rhonchi. CARDIOVASCULAR:  Regular rate and rhythm without murmur, rub or gallop. ABDOMEN:  Soft, non-tender, with active bowel sounds, and no hepatosplenomegaly.  No masses. SKIN:  No rashes, ulcers or lesions. EXTREMITIES: No edema, no skin discoloration or tenderness.  No palpable cords. LYMPH NODES: No palpable cervical, supraclavicular, axillary or inguinal adenopathy  NEUROLOGICAL: Unremarkable. PSYCH:  Appropriate.    LAB RESULTS:  Lab Results  Component Value Date   NA 140  07/25/2016   K 4.2 07/25/2016   CL 105 07/25/2016   CO2 29 07/25/2016   GLUCOSE 136 (H) 07/25/2016   BUN 17 07/25/2016   CREATININE 0.92 07/25/2016   CALCIUM 9.2 07/25/2016   PROT 6.8 07/25/2016   ALBUMIN 4.0 07/25/2016   AST 23 07/25/2016   ALT 24 07/25/2016   ALKPHOS 72 07/25/2016   BILITOT 0.5 07/25/2016   GFRNONAA 59 (L) 07/25/2016   GFRAA >60 07/25/2016    Lab Results  Component Value Date   WBC 6.3 07/25/2016   NEUTROABS 4.5 07/25/2016   HGB 12.4 07/25/2016   HCT 36.0 07/25/2016   MCV 86.5 07/25/2016   PLT 176 07/25/2016     STUDIES: No results found.  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 High risk Oncotype with recurrence score of 44.   PLAN:    1. Breast cancer:  Pathologic stage Ia ER positive, PR negative, HER-2  negative invasive carcinoma of the upper outer quadrant of the right breast: Patient was noted to have a Oncotype DX recurrence score 44 which is considered high risk. Her 10 year risk of distant recurrence with tamoxifen alone is approximately 30%, therefore have recommended adjuvant chemotherapy using Taxotere and Cytoxan. Since patient had lumpectomy, she still will require adjuvant XRT which will completed at the conclusion of her chemotherapy. She will also benefit from an aromatase inhibitor for 5 years given the ER positivity of her tumor.   Patient tolerated cycle 1 of 4 of Taxotere and Cytoxan last week without significant side effects.  ANC nadir was 100 on day 8.  Cycle #2 TC today with OnPro Neulasta RTC in 10 days for CBC with diff RTC in 3 weeks for MD assessment, labs (CBC with diff, CMP, Mg), and cycle #3 TC  2. Neutropenia:  Counts good today.  OnPro Neulasta added with today's treatment and future cycles secondary to neutropenia (ANC 100) with cycle #1.  3. Mouth pain:  Resolved.  Hopefully will have decreased mouth pain with cycle  #2 secondary to improved counts.  Approximately 30 minutes was spent in discussion of which greater than 50% was consultation.  Patient expressed understanding and was in agreement with this plan. She also understands that She can call clinic at any time with any questions, concerns, or complaints.   Cancer Staging Primary cancer of upper outer quadrant of right female breast ([place]) HER 2 NEGATIVE  (FISH) Staging form: Breast, AJCC 8th Edition - Pathologic stage from 06/12/2016: Stage IA (pT1c, pN0, cM0, G2, ER: Positive, PR: Negative, HER2: Negative) - Signed by Lloyd Huger, MD on 06/12/2016   Lequita Asal, MD    07/25/2016 9:50 AM

## 2016-07-25 NOTE — Progress Notes (Signed)
Patient offers no concerns today. 

## 2016-07-26 ENCOUNTER — Other Ambulatory Visit: Payer: Self-pay | Admitting: *Deleted

## 2016-07-26 MED ORDER — DEXAMETHASONE 4 MG PO TABS: 4.0000 mg | ORAL_TABLET | Freq: Two times a day (BID) | ORAL | 0 refills | Status: AC

## 2016-07-26 NOTE — Telephone Encounter (Signed)
Patient called to report that she is having reaction to chemo yesterday, She got TC for second cycle. States she has redness which started on right side of face and has spread and become a rash to her left face and chest .  Per Dr Rogue Bussing, rash probably from Taxane, decadron 4 mg bid times 3 days.  Patient advised rx sent to [place] per request of patient.

## 2016-07-31 ENCOUNTER — Telehealth: Payer: Self-pay | Admitting: *Deleted

## 2016-07-31 NOTE — Telephone Encounter (Signed)
Called hat she has had diarrhea since 2:30 AM. Brown watery stool every 2 hours; "like a virus". Advised to take Imodium AD, change diet to clear liquids and dry toast. Advised to call back if not improved. She repeated back to me and agrees to call back if no better

## 2016-08-04 ENCOUNTER — Inpatient Hospital Stay: Payer: Medicare HMO

## 2016-08-04 DIAGNOSIS — Z5111 Encounter for antineoplastic chemotherapy: Secondary | ICD-10-CM | POA: Diagnosis not present

## 2016-08-04 DIAGNOSIS — C50411 Malignant neoplasm of upper-outer quadrant of right female breast: Secondary | ICD-10-CM

## 2016-08-04 LAB — CBC WITH DIFFERENTIAL/PLATELET
Basophils Absolute: 0.1 10*3/uL (ref 0–0.1)
Basophils Relative: 0 %
Eosinophils Absolute: 0 10*3/uL (ref 0–0.7)
Eosinophils Relative: 0 %
HCT: 34.7 % — ABNORMAL LOW (ref 35.0–47.0)
Hemoglobin: 11.7 g/dL — ABNORMAL LOW (ref 12.0–16.0)
Lymphocytes Relative: 10 %
Lymphs Abs: 1.8 10*3/uL (ref 1.0–3.6)
MCH: 29.1 pg (ref 26.0–34.0)
MCHC: 33.6 g/dL (ref 32.0–36.0)
MCV: 86.7 fL (ref 80.0–100.0)
Monocytes Absolute: 0.9 10*3/uL (ref 0.2–0.9)
Monocytes Relative: 5 %
Neutro Abs: 14.9 10*3/uL — ABNORMAL HIGH (ref 1.4–6.5)
Neutrophils Relative %: 85 %
Platelets: 197 10*3/uL (ref 150–440)
RBC: 4.01 MIL/uL (ref 3.80–5.20)
RDW: 13.4 % (ref 11.5–14.5)
WBC: 17.7 10*3/uL — ABNORMAL HIGH (ref 3.6–11.0)

## 2016-08-14 NOTE — Progress Notes (Signed)
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 High risk Oncotype with recurrence score of 44.  INTERVAL HISTORY: Patient returns to clinic today for further evaluation and consideration of cycle 2 of Taxotere and Cytoxan. Patient states after cycle 1 she developed a rash on her face and torso approximately 24-48 hours after treatment. She currently feels well and is asymptomatic. She has no neurologic complaints. She denies any recent fevers or illnesses. She has a good appetite and denies weight loss. She denies any other pain. She has no chest pain or shortness of breath. She denies any nausea, vomiting, constipation, or diarrhea. She has no urinary complaints. Patient offers no specific complaints today.  REVIEW OF SYSTEMS:   Review of Systems  Constitutional: Negative.  Negative for fever, malaise/fatigue and weight loss.  Respiratory: Negative.  Negative for cough and shortness of breath.   Cardiovascular: Negative.  Negative for chest pain and leg swelling.  Gastrointestinal: Negative.  Negative for abdominal pain.  Genitourinary: Negative.   Musculoskeletal: Negative.   Skin: Positive for rash.  Neurological: Negative.  Negative for weakness.  Psychiatric/Behavioral: The patient is nervous/anxious.     As per HPI. Otherwise, a complete review of systems is negative.  PAST MEDICAL HISTORY: Past Medical History:  Diagnosis Date  . Breast cancer ([place]) 05/11/2016   T1c,N0; ER>90%, PR neg, Her 2 neu negative by FISH. Mammoprint: High risk.   . Cancer [facility]) 2012   uterine-  radiation  . GERD (gastroesophageal reflux disease)   . Hyperlipidemia     PAST SURGICAL HISTORY: Past Surgical History:  Procedure Laterality Date  . ABDOMINAL HYSTERECTOMY  2012  . BREAST BIOPSY Left    neg  . BREAST BIOPSY Right 05/11/2016   INVASIVE MAMMARY CARCINOMA  . BREAST BIOPSY Bilateral 05/26/2016   bilat stereo path pending  . BREAST CYST ASPIRATION Left    neg  . BREAST EXCISIONAL BIOPSY Right 06/01/2016   + lumpectomy  . BREAST LUMPECTOMY WITH SENTINEL LYMPH NODE BIOPSY Right 06/01/2016   Procedure: BREAST LUMPECTOMY WITH SENTINEL LYMPH NODE BX;  Surgeon: Robert Bellow, MD;  Location: [facility] ORS;  Service: General;  Laterality: Right;    FAMILY HISTORY: Family History  Problem Relation Age of Onset  . Breast cancer Sister 42  . Cancer Brother     brain  . Cancer Sister   . Non-Hodgkin's lymphoma Brother     ADVANCED DIRECTIVES (Y/N):  N  HEALTH MAINTENANCE: Social History  Substance Use Topics  . Smoking status: Never Smoker  . Smokeless tobacco: Never Used  . Alcohol use No     Colonoscopy:  PAP:  Bone density:  Lipid panel:  Allergies  Allergen Reactions  . Sulphadimidine [Sulfamethazine] Swelling  . Petrolatum-Zinc Oxide Swelling    Current Outpatient Prescriptions  Medication Sig Dispense Refill  . acetaminophen (TYLENOL) 500 MG tablet Take 1,000 mg by mouth every 6 (six) hours as needed for mild pain.    Marland Kitchen aspirin 81 MG chewable tablet Chew 81 mg by mouth at bedtime.     Marland Kitchen doxycycline (VIBRAMYCIN) 100 MG capsule Take by mouth.    . furosemide (LASIX)  20 MG tablet Take 20 mg by mouth daily as needed for fluid.     Marland Kitchen ondansetron (ZOFRAN) 8 MG tablet Take 1 tablet (8 mg total) by mouth 2 (two) times daily as needed for refractory nausea / vomiting. 60 tablet 2  . pantoprazole (PROTONIX) 40 MG tablet Take 40 mg by mouth every morning.     . prochlorperazine (COMPAZINE) 10 MG tablet Take 1 tablet (10 mg total) by mouth every 6 (six) hours  as needed (Nausea or vomiting). 60 tablet 2  . atorvastatin (LIPITOR) 80 MG tablet Take 80 mg by mouth daily at 6 PM.     . methylPREDNISolone (MEDROL DOSEPAK) 4 MG TBPK tablet Take as directed 21 tablet 0  . niacin (NIASPAN) 500 MG CR tablet Take 1 mg by mouth at bedtime.      No current facility-administered medications for this visit.     OBJECTIVE: Vitals:   08/15/16 1123  BP: (!) 152/86  Pulse: 90  Temp: 98.5 F (36.9 C)     Body mass index is 27.62 kg/m.    ECOG FS:0 - Asymptomatic  General: Well-developed, well-nourished, no acute distress. Eyes: Pink conjunctiva, anicteric sclera. Breasts: Well healing surgical scar on right breast. Exam deferred today. Lungs: Clear to auscultation bilaterally. Heart: Regular rate and rhythm. No rubs, murmurs, or gallops. Abdomen: Soft, nontender, nondistended. No organomegaly noted, normoactive bowel sounds. Musculoskeletal: No edema, cyanosis, or clubbing. Neuro: Alert, answering all questions appropriately. Cranial nerves grossly intact. Skin: No rashes or petechiae noted. Psych: Normal affect.   LAB RESULTS:  Lab Results  Component Value Date   NA 140 08/15/2016   K 4.2 08/15/2016   CL 106 08/15/2016   CO2 30 08/15/2016   GLUCOSE 142 (H) 08/15/2016   BUN 22 (H) 08/15/2016   CREATININE 0.80 08/15/2016   CALCIUM 9.2 08/15/2016   PROT 6.4 (L) 08/15/2016   ALBUMIN 3.8 08/15/2016   AST 22 08/15/2016   ALT 23 08/15/2016   ALKPHOS 67 08/15/2016   BILITOT 0.6 08/15/2016   GFRNONAA >60 08/15/2016   GFRAA >60 08/15/2016    Lab Results  Component Value Date   WBC 3.5 (L) 08/15/2016   NEUTROABS 2.2 08/15/2016   HGB 11.4 (L) 08/15/2016   HCT 33.5 (L) 08/15/2016   MCV 87.6 08/15/2016   PLT 177 08/15/2016     STUDIES: No results found.  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 High risk Oncotype with recurrence score of  44.   PLAN:    1. 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 Her 10 year risk of distant recurrence with tamoxifen alone is approximately 30%, therefore have recommended adjuvant chemotherapy using Taxotere and Cytoxan. Since patient had lumpectomy, she still will require adjuvant XRT which will completed at the conclusion of her chemotherapy. She will also benefit from an aromatase inhibitor for 5 years given the ER positivity of her tumor. Proceed with cycle 2 of 4 of Taxotere and Cytoxan today. Patient will also receive Neulasta support. Return to clinic in 3 weeks for consideration of cycle 3.  2. Neutropenia:  Will add Onpro Neulasta for the remainder of the patient's treatments. 3. Mouth pain: Continue baking soda and water as needed. 4. Rash: Possibly a late reaction  to Taxotere. Patient has been instructed to call clinic if it recurs at which time we will call in a Medrol Dosepak.  Approximately 30 minutes was spent in discussion of which greater than 50% was consultation.  Patient expressed understanding and was in agreement with this plan. She also understands that She can call clinic at any time with any questions, concerns, or complaints.   Cancer Staging Primary cancer of upper outer quadrant of right female breast ([place]) HER 2 NEGATIVE  (FISH) Staging form: Breast, AJCC 8th Edition - Pathologic stage from 06/12/2016: Stage IA (pT1c, pN0, cM0, G2, ER: Positive, PR: Negative, HER2: Negative) - Signed by Lloyd Huger, MD on 06/12/2016   Lloyd Huger, MD   08/16/2016 2:27 PM

## 2016-08-15 ENCOUNTER — Inpatient Hospital Stay: Payer: Medicare HMO | Attending: Oncology

## 2016-08-15 ENCOUNTER — Inpatient Hospital Stay: Payer: Medicare HMO

## 2016-08-15 ENCOUNTER — Inpatient Hospital Stay (HOSPITAL_BASED_OUTPATIENT_CLINIC_OR_DEPARTMENT_OTHER): Payer: Medicare HMO | Admitting: Oncology

## 2016-08-15 ENCOUNTER — Encounter: Payer: Self-pay | Admitting: Oncology

## 2016-08-15 ENCOUNTER — Encounter: Payer: Self-pay | Admitting: *Deleted

## 2016-08-15 VITALS — BP 152/86 | HR 90 | Temp 98.5°F | Wt 171.1 lb

## 2016-08-15 DIAGNOSIS — Z7982 Long term (current) use of aspirin: Secondary | ICD-10-CM | POA: Insufficient documentation

## 2016-08-15 DIAGNOSIS — E785 Hyperlipidemia, unspecified: Secondary | ICD-10-CM | POA: Diagnosis not present

## 2016-08-15 DIAGNOSIS — Z807 Family history of other malignant neoplasms of lymphoid, hematopoietic and related tissues: Secondary | ICD-10-CM | POA: Insufficient documentation

## 2016-08-15 DIAGNOSIS — C50411 Malignant neoplasm of upper-outer quadrant of right female breast: Secondary | ICD-10-CM

## 2016-08-15 DIAGNOSIS — Z5111 Encounter for antineoplastic chemotherapy: Secondary | ICD-10-CM | POA: Insufficient documentation

## 2016-08-15 DIAGNOSIS — D701 Agranulocytosis secondary to cancer chemotherapy: Secondary | ICD-10-CM | POA: Diagnosis not present

## 2016-08-15 DIAGNOSIS — T451X5S Adverse effect of antineoplastic and immunosuppressive drugs, sequela: Secondary | ICD-10-CM | POA: Insufficient documentation

## 2016-08-15 DIAGNOSIS — Z7952 Long term (current) use of systemic steroids: Secondary | ICD-10-CM

## 2016-08-15 DIAGNOSIS — Z79899 Other long term (current) drug therapy: Secondary | ICD-10-CM

## 2016-08-15 DIAGNOSIS — R21 Rash and other nonspecific skin eruption: Secondary | ICD-10-CM | POA: Insufficient documentation

## 2016-08-15 DIAGNOSIS — K219 Gastro-esophageal reflux disease without esophagitis: Secondary | ICD-10-CM | POA: Insufficient documentation

## 2016-08-15 DIAGNOSIS — K1379 Other lesions of oral mucosa: Secondary | ICD-10-CM | POA: Diagnosis not present

## 2016-08-15 DIAGNOSIS — Z170 Estrogen receptor positive status [ER+]: Secondary | ICD-10-CM | POA: Insufficient documentation

## 2016-08-15 DIAGNOSIS — Z803 Family history of malignant neoplasm of breast: Secondary | ICD-10-CM | POA: Diagnosis not present

## 2016-08-15 LAB — CBC WITH DIFFERENTIAL/PLATELET
Basophils Absolute: 0 10*3/uL (ref 0–0.1)
Basophils Relative: 1 %
Eosinophils Absolute: 0 10*3/uL (ref 0–0.7)
Eosinophils Relative: 0 %
HCT: 33.5 % — ABNORMAL LOW (ref 35.0–47.0)
Hemoglobin: 11.4 g/dL — ABNORMAL LOW (ref 12.0–16.0)
Lymphocytes Relative: 28 %
Lymphs Abs: 1 10*3/uL (ref 1.0–3.6)
MCH: 29.8 pg (ref 26.0–34.0)
MCHC: 34 g/dL (ref 32.0–36.0)
MCV: 87.6 fL (ref 80.0–100.0)
Monocytes Absolute: 0.3 10*3/uL (ref 0.2–0.9)
Monocytes Relative: 9 %
Neutro Abs: 2.2 10*3/uL (ref 1.4–6.5)
Neutrophils Relative %: 62 %
Platelets: 177 10*3/uL (ref 150–440)
RBC: 3.82 MIL/uL (ref 3.80–5.20)
RDW: 14 % (ref 11.5–14.5)
WBC: 3.5 10*3/uL — ABNORMAL LOW (ref 3.6–11.0)

## 2016-08-15 LAB — COMPREHENSIVE METABOLIC PANEL
ALT: 23 U/L (ref 14–54)
AST: 22 U/L (ref 15–41)
Albumin: 3.8 g/dL (ref 3.5–5.0)
Alkaline Phosphatase: 67 U/L (ref 38–126)
Anion gap: 4 — ABNORMAL LOW (ref 5–15)
BUN: 22 mg/dL — ABNORMAL HIGH (ref 6–20)
CO2: 30 mmol/L (ref 22–32)
Calcium: 9.2 mg/dL (ref 8.9–10.3)
Chloride: 106 mmol/L (ref 101–111)
Creatinine, Ser: 0.8 mg/dL (ref 0.44–1.00)
GFR calc Af Amer: >60 mL/min (ref >60)
GFR calc non Af Amer: >60 mL/min (ref >60)
Glucose, Bld: 142 mg/dL — ABNORMAL HIGH (ref 65–99)
Potassium: 4.2 mmol/L (ref 3.5–5.1)
Sodium: 140 mmol/L (ref 135–145)
Total Bilirubin: 0.6 mg/dL (ref 0.3–1.2)
Total Protein: 6.4 g/dL — ABNORMAL LOW (ref 6.5–8.1)

## 2016-08-15 LAB — MAGNESIUM: Magnesium: 2 mg/dL (ref 1.7–2.4)

## 2016-08-15 MED ORDER — PEGFILGRASTIM 6 MG/0.6ML ~~LOC~~ PSKT
6.0000 mg | PREFILLED_SYRINGE | Freq: Once | SUBCUTANEOUS | Status: AC
  Administered 2016-08-15: 6 mg via SUBCUTANEOUS
  Filled 2016-08-15: qty 0.6

## 2016-08-15 MED ORDER — DEXAMETHASONE SODIUM PHOSPHATE 10 MG/ML IJ SOLN
10.0000 mg | Freq: Once | INTRAMUSCULAR | Status: AC
  Administered 2016-08-15: 10 mg via INTRAVENOUS
  Filled 2016-08-15: qty 1

## 2016-08-15 MED ORDER — SODIUM CHLORIDE 0.9 % IV SOLN
600.0000 mg/m2 | Freq: Once | INTRAVENOUS | Status: AC
  Administered 2016-08-15: 1140 mg via INTRAVENOUS
  Filled 2016-08-15: qty 50

## 2016-08-15 MED ORDER — PALONOSETRON HCL INJECTION 0.25 MG/5ML: 0.2500 mg | Freq: Once | INTRAVENOUS | Status: DC

## 2016-08-15 MED ORDER — SODIUM CHLORIDE 0.9% FLUSH
10.0000 mL | INTRAVENOUS | Status: DC | PRN
  Filled 2016-08-15: qty 10

## 2016-08-15 MED ORDER — PALONOSETRON HCL INJECTION 0.25 MG/5ML
0.2500 mg | Freq: Once | INTRAVENOUS | Status: AC
  Administered 2016-08-15: 0.25 mg via INTRAVENOUS
  Filled 2016-08-15: qty 5

## 2016-08-15 MED ORDER — HEPARIN SOD (PORK) LOCK FLUSH 100 UNIT/ML IV SOLN: 500.0000 [IU] | Freq: Once | INTRAVENOUS | Status: DC | PRN

## 2016-08-15 MED ORDER — SODIUM CHLORIDE 0.9 % IV SOLN
Freq: Once | INTRAVENOUS | Status: AC
  Administered 2016-08-15: 11:00:00 via INTRAVENOUS
  Filled 2016-08-15: qty 1000

## 2016-08-15 MED ORDER — DEXTROSE 5 % IV SOLN
75.0000 mg/m2 | Freq: Once | INTRAVENOUS | Status: AC
  Administered 2016-08-15: 140 mg via INTRAVENOUS
  Filled 2016-08-15: qty 14

## 2016-08-15 NOTE — Progress Notes (Signed)
  Oncology Nurse Navigator Documentation  Navigator Location: [facility] (08/15/16 1200)   )Navigator Encounter Type: Clinic/MDC (08/15/16 1200)                       Treatment Phase: Treatment (08/15/16 1200) Barriers/Navigation Needs: No Needs (08/15/16 1200)           Met patient during her chemotherapy.  No needs at this time.  Offered support.  She is to call if she has any questions or needs.               Time Spent with Patient: 15 (08/15/16 1200)

## 2016-08-16 ENCOUNTER — Telehealth: Payer: Self-pay | Admitting: *Deleted

## 2016-08-16 MED ORDER — METHYLPREDNISOLONE 4 MG PO TBPK: ORAL_TABLET | ORAL | 0 refills | Status: DC

## 2016-08-16 NOTE — Telephone Encounter (Signed)
-----   Message from [name] sent at 08/16/2016  8:51 AM EDT ----- Working downstairs till Courtney Herrera comes in this pt called answering service and stated thinks she is having a allergic reaction. Please call

## 2016-08-16 NOTE — Telephone Encounter (Signed)
Returned call to patient, left message on VM to call me back

## 2016-08-16 NOTE — Telephone Encounter (Signed)
Per Dr Grayland Ormond. Medrol dose pack. Patient informed

## 2016-08-16 NOTE — Telephone Encounter (Signed)
States she is having a rash on her face and chest and is asking for steroid prescribed as before. States it feels like sun poisoning.  [place]

## 2016-08-23 ENCOUNTER — Encounter: Payer: Self-pay | Admitting: Hematology and Oncology

## 2016-08-23 DIAGNOSIS — Z5111 Encounter for antineoplastic chemotherapy: Secondary | ICD-10-CM | POA: Insufficient documentation

## 2016-09-03 NOTE — Progress Notes (Signed)
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 High risk Oncotype with recurrence score of 44.  INTERVAL HISTORY: Patient returns to clinic today for further evaluation and consideration of cycle 4 of Taxotere and Cytoxan. She currently feels well and is asymptomatic. She has no neurologic complaints. She denies any recent fevers or illnesses. She has a good appetite and denies weight loss. She denies any other pain. She has no chest pain or shortness of breath. She denies any nausea, vomiting, constipation, or diarrhea. She has no urinary complaints. Patient offers no specific complaints today.  REVIEW OF SYSTEMS:   Review of Systems  Constitutional: Negative.  Negative for fever, malaise/fatigue and weight loss.  Respiratory: Negative.  Negative for cough and shortness of breath.   Cardiovascular: Negative.  Negative for chest pain and leg swelling.  Gastrointestinal: Negative.  Negative for abdominal pain.  Genitourinary: Negative.   Musculoskeletal: Negative.   Skin: Negative.  Negative for rash.  Neurological: Negative.  Negative for weakness.  Psychiatric/Behavioral: Negative.  The patient is not nervous/anxious.     As per HPI. Otherwise, a complete review of systems is negative.  PAST MEDICAL HISTORY: Past Medical History:  Diagnosis Date  . Breast cancer ([place]) 05/11/2016   T1c,N0; ER>90%, PR neg, Her 2 neu negative by FISH. Mammoprint: High risk.   . Cancer [facility]) 2012   uterine- radiation  . GERD (gastroesophageal reflux disease)   . Hyperlipidemia     PAST  SURGICAL HISTORY: Past Surgical History:  Procedure Laterality Date  . ABDOMINAL HYSTERECTOMY  2012  . BREAST BIOPSY Left    neg  . BREAST BIOPSY Right 05/11/2016   INVASIVE MAMMARY CARCINOMA  . BREAST BIOPSY Bilateral 05/26/2016   bilat stereo path pending  . BREAST CYST ASPIRATION Left    neg  . BREAST EXCISIONAL BIOPSY Right 06/01/2016   + lumpectomy  . BREAST LUMPECTOMY WITH SENTINEL LYMPH NODE BIOPSY Right 06/01/2016   Procedure: BREAST LUMPECTOMY WITH SENTINEL LYMPH NODE BX;  Surgeon: Robert Bellow, MD;  Location: [facility] ORS;  Service: General;  Laterality: Right;    FAMILY HISTORY: Family History  Problem Relation Age of Onset  . Breast cancer Sister 65  . Cancer Brother        brain  . Cancer Sister   . Non-Hodgkin's lymphoma Brother     ADVANCED DIRECTIVES (Y/N):  N  HEALTH MAINTENANCE: Social History  Substance Use Topics  . Smoking status: Never Smoker  . Smokeless tobacco: Never Used  . Alcohol use No     Colonoscopy:  PAP:  Bone density:  Lipid panel:  Allergies  Allergen Reactions  . Sulphadimidine [Sulfamethazine] Swelling  . Petrolatum-Zinc Oxide Swelling    Current Outpatient Prescriptions  Medication Sig Dispense Refill  . acetaminophen (TYLENOL) 500 MG tablet Take 1,000 mg by mouth every 6 (six) hours as needed for mild pain.    Marland Kitchen aspirin 81 MG chewable tablet Chew 81 mg by mouth at bedtime.     . furosemide (LASIX) 20 MG tablet Take 20 mg by mouth daily as needed for fluid.     . methylPREDNISolone (MEDROL DOSEPAK) 4 MG  TBPK tablet Take as directed 21 tablet 0  . ondansetron (ZOFRAN) 8 MG tablet Take 1 tablet (8 mg total) by mouth 2 (two) times daily as needed for refractory nausea / vomiting. 60 tablet 2  . pantoprazole (PROTONIX) 40 MG tablet Take 40 mg by mouth every morning.     . prochlorperazine (COMPAZINE) 10 MG tablet Take 1 tablet (10 mg total) by mouth every 6 (six) hours as needed (Nausea or vomiting). 60 tablet 2  .  atorvastatin (LIPITOR) 80 MG tablet Take 80 mg by mouth daily at 6 PM.     . niacin (NIASPAN) 500 MG CR tablet Take 1 mg by mouth at bedtime.      No current facility-administered medications for this visit.     OBJECTIVE: Vitals:   09/05/16 0951  BP: (!) 145/83  Pulse: 93  Resp: 20  Temp: 98.4 F (36.9 C)     Body mass index is 28.07 kg/m.    ECOG FS:0 - Asymptomatic  General: Well-developed, well-nourished, no acute distress. Eyes: Pink conjunctiva, anicteric sclera. Breasts: Well healing surgical scar on right breast. Exam deferred today. Lungs: Clear to auscultation bilaterally. Heart: Regular rate and rhythm. No rubs, murmurs, or gallops. Abdomen: Soft, nontender, nondistended. No organomegaly noted, normoactive bowel sounds. Musculoskeletal: No edema, cyanosis, or clubbing. Neuro: Alert, answering all questions appropriately. Cranial nerves grossly intact. Skin: No rashes or petechiae noted. Psych: Normal affect.   LAB RESULTS:  Lab Results  Component Value Date   NA 140 08/15/2016   K 4.2 08/15/2016   CL 106 08/15/2016   CO2 30 08/15/2016   GLUCOSE 142 (H) 08/15/2016   BUN 22 (H) 08/15/2016   CREATININE 0.80 08/15/2016   CALCIUM 9.2 08/15/2016   PROT 6.4 (L) 08/15/2016   ALBUMIN 3.8 08/15/2016   AST 22 08/15/2016   ALT 23 08/15/2016   ALKPHOS 67 08/15/2016   BILITOT 0.6 08/15/2016   GFRNONAA >60 08/15/2016   GFRAA >60 08/15/2016    Lab Results  Component Value Date   WBC 4.3 09/05/2016   NEUTROABS 3.1 09/05/2016   HGB 10.5 (L) 09/05/2016   HCT 30.1 (L) 09/05/2016   MCV 87.3 09/05/2016   PLT 192 09/05/2016     STUDIES: No results found.  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 High risk Oncotype with recurrence score of 44.   PLAN:    1. Pathologic stage Ia ER positive, PR negative, HER-2 negative invasive carcinoma of the upper outer  quadrant of the right breast: Patient was noted to have a Oncotype DX recurrence score 44 which is considered high risk. Her 10 year risk of distant recurrence with tamoxifen alone is approximately 30%, therefore have recommended adjuvant chemotherapy using Taxotere and Cytoxan. Since patient had lumpectomy, she still will require adjuvant XRT which will completed at the conclusion of her chemotherapy. She will also benefit from an aromatase inhibitor for 5 years given the ER positivity of her tumor. Proceed with cycle 4 of 4 of Taxotere and Cytoxan today. Patient will also receive Neulasta support. Patient was given a referral to radiation oncology. Return to clinic in 6 weeks with repeat laboratory work and further evaluation. 2. Neutropenia:  Onpro Neulasta as above. 3. Mouth pain: Patient does not complain of this today. Continue baking soda and water as needed.  Approximately 30 minutes was spent in discussion of which greater than 50% was consultation.  Patient expressed  understanding and was in agreement with this plan. She also understands that She can call clinic at any time with any questions, concerns, or complaints.   Cancer Staging Primary cancer of upper outer quadrant of right female breast ([place]) HER 2 NEGATIVE  (FISH) Staging form: Breast, AJCC 8th Edition - Pathologic stage from 06/12/2016: Stage IA (pT1c, pN0, cM0, G2, ER: Positive, PR: Negative, HER2: Negative) - Signed by Lloyd Huger, MD on 06/12/2016   Lloyd Huger, MD   09/05/2016 10:01 AM

## 2016-09-05 ENCOUNTER — Ambulatory Visit: Payer: Medicare HMO | Admitting: Oncology

## 2016-09-05 ENCOUNTER — Inpatient Hospital Stay: Payer: Medicare HMO

## 2016-09-05 ENCOUNTER — Inpatient Hospital Stay (HOSPITAL_BASED_OUTPATIENT_CLINIC_OR_DEPARTMENT_OTHER): Payer: Medicare HMO | Admitting: Oncology

## 2016-09-05 VITALS — BP 145/83 | HR 93 | Temp 98.4°F | Resp 20 | Wt 173.9 lb

## 2016-09-05 DIAGNOSIS — T451X5S Adverse effect of antineoplastic and immunosuppressive drugs, sequela: Secondary | ICD-10-CM

## 2016-09-05 DIAGNOSIS — D701 Agranulocytosis secondary to cancer chemotherapy: Secondary | ICD-10-CM | POA: Diagnosis not present

## 2016-09-05 DIAGNOSIS — Z807 Family history of other malignant neoplasms of lymphoid, hematopoietic and related tissues: Secondary | ICD-10-CM

## 2016-09-05 DIAGNOSIS — Z170 Estrogen receptor positive status [ER+]: Secondary | ICD-10-CM | POA: Diagnosis not present

## 2016-09-05 DIAGNOSIS — K219 Gastro-esophageal reflux disease without esophagitis: Secondary | ICD-10-CM

## 2016-09-05 DIAGNOSIS — C50411 Malignant neoplasm of upper-outer quadrant of right female breast: Secondary | ICD-10-CM

## 2016-09-05 DIAGNOSIS — E785 Hyperlipidemia, unspecified: Secondary | ICD-10-CM

## 2016-09-05 DIAGNOSIS — Z5111 Encounter for antineoplastic chemotherapy: Secondary | ICD-10-CM | POA: Diagnosis not present

## 2016-09-05 DIAGNOSIS — Z79899 Other long term (current) drug therapy: Secondary | ICD-10-CM | POA: Diagnosis not present

## 2016-09-05 DIAGNOSIS — R21 Rash and other nonspecific skin eruption: Secondary | ICD-10-CM

## 2016-09-05 DIAGNOSIS — Z7982 Long term (current) use of aspirin: Secondary | ICD-10-CM

## 2016-09-05 DIAGNOSIS — Z7952 Long term (current) use of systemic steroids: Secondary | ICD-10-CM

## 2016-09-05 DIAGNOSIS — Z803 Family history of malignant neoplasm of breast: Secondary | ICD-10-CM

## 2016-09-05 LAB — CBC WITH DIFFERENTIAL/PLATELET
BASOS ABS: 0 10*3/uL (ref 0–0.1)
Basophils Relative: 1 %
EOS ABS: 0 10*3/uL (ref 0–0.7)
EOS PCT: 1 %
HCT: 30.1 % — ABNORMAL LOW (ref 35.0–47.0)
Hemoglobin: 10.5 g/dL — ABNORMAL LOW (ref 12.0–16.0)
Lymphocytes Relative: 15 %
Lymphs Abs: 0.6 10*3/uL — ABNORMAL LOW (ref 1.0–3.6)
MCH: 30.4 pg (ref 26.0–34.0)
MCHC: 34.8 g/dL (ref 32.0–36.0)
MCV: 87.3 fL (ref 80.0–100.0)
Monocytes Absolute: 0.5 10*3/uL (ref 0.2–0.9)
Monocytes Relative: 11 %
NEUTROS PCT: 72 %
Neutro Abs: 3.1 10*3/uL (ref 1.4–6.5)
PLATELETS: 192 10*3/uL (ref 150–440)
RBC: 3.45 MIL/uL — AB (ref 3.80–5.20)
RDW: 16.1 % — ABNORMAL HIGH (ref 11.5–14.5)
WBC: 4.3 10*3/uL (ref 3.6–11.0)

## 2016-09-05 LAB — COMPREHENSIVE METABOLIC PANEL
ALBUMIN: 3.5 g/dL (ref 3.5–5.0)
ALT: 17 U/L (ref 14–54)
AST: 18 U/L (ref 15–41)
Alkaline Phosphatase: 83 U/L (ref 38–126)
Anion gap: 6 (ref 5–15)
BUN: 21 mg/dL — AB (ref 6–20)
CO2: 29 mmol/L (ref 22–32)
CREATININE: 0.87 mg/dL (ref 0.44–1.00)
Calcium: 8.9 mg/dL (ref 8.9–10.3)
Chloride: 108 mmol/L (ref 101–111)
GFR calc Af Amer: >60 mL/min (ref >60)
GFR calc non Af Amer: >60 mL/min (ref >60)
GLUCOSE: 119 mg/dL — AB (ref 65–99)
Potassium: 4 mmol/L (ref 3.5–5.1)
SODIUM: 143 mmol/L (ref 135–145)
Total Bilirubin: 0.6 mg/dL (ref 0.3–1.2)
Total Protein: 6.3 g/dL — ABNORMAL LOW (ref 6.5–8.1)

## 2016-09-05 MED ORDER — SODIUM CHLORIDE 0.9 % IV SOLN
Freq: Once | INTRAVENOUS | Status: AC
  Administered 2016-09-05: 11:00:00 via INTRAVENOUS
  Filled 2016-09-05: qty 1000

## 2016-09-05 MED ORDER — CYCLOPHOSPHAMIDE CHEMO INJECTION 1 GM
600.0000 mg/m2 | Freq: Once | INTRAMUSCULAR | Status: AC
  Administered 2016-09-05: 1140 mg via INTRAVENOUS
  Filled 2016-09-05: qty 50

## 2016-09-05 MED ORDER — PEGFILGRASTIM 6 MG/0.6ML ~~LOC~~ PSKT
6.0000 mg | PREFILLED_SYRINGE | Freq: Once | SUBCUTANEOUS | Status: AC
  Administered 2016-09-05: 6 mg via SUBCUTANEOUS
  Filled 2016-09-05: qty 0.6

## 2016-09-05 MED ORDER — PALONOSETRON HCL INJECTION 0.25 MG/5ML
0.2500 mg | Freq: Once | INTRAVENOUS | Status: AC
  Administered 2016-09-05: 0.25 mg via INTRAVENOUS
  Filled 2016-09-05: qty 5

## 2016-09-05 MED ORDER — DOCETAXEL CHEMO INJECTION 160 MG/16ML
75.0000 mg/m2 | Freq: Once | INTRAVENOUS | Status: AC
  Administered 2016-09-05: 140 mg via INTRAVENOUS
  Filled 2016-09-05: qty 14

## 2016-09-05 MED ORDER — DEXAMETHASONE SODIUM PHOSPHATE 10 MG/ML IJ SOLN
10.0000 mg | Freq: Once | INTRAMUSCULAR | Status: AC
  Administered 2016-09-05: 10 mg via INTRAVENOUS
  Filled 2016-09-05: qty 1

## 2016-09-05 NOTE — Progress Notes (Signed)
Patient reports watery eyes, denies any other concerns today.

## 2016-09-13 ENCOUNTER — Ambulatory Visit
Admission: RE | Admit: 2016-09-13 | Discharge: 2016-09-13 | Disposition: A | Discharge status: Home / Self Care | Payer: Medicare HMO | Source: Ambulatory Visit | Attending: Radiation Oncology | Admitting: Radiation Oncology

## 2016-09-13 ENCOUNTER — Encounter: Payer: Self-pay | Admitting: Radiation Oncology

## 2016-09-13 VITALS — BP 152/93 | HR 89 | Temp 98.0°F | Wt 170.7 lb

## 2016-09-13 DIAGNOSIS — Z510 Encounter for antineoplastic radiation therapy: Secondary | ICD-10-CM | POA: Diagnosis not present

## 2016-09-13 DIAGNOSIS — R5383 Other fatigue: Secondary | ICD-10-CM | POA: Insufficient documentation

## 2016-09-13 DIAGNOSIS — Z808 Family history of malignant neoplasm of other organs or systems: Secondary | ICD-10-CM | POA: Insufficient documentation

## 2016-09-13 DIAGNOSIS — Z7982 Long term (current) use of aspirin: Secondary | ICD-10-CM | POA: Insufficient documentation

## 2016-09-13 DIAGNOSIS — K219 Gastro-esophageal reflux disease without esophagitis: Secondary | ICD-10-CM | POA: Insufficient documentation

## 2016-09-13 DIAGNOSIS — Z170 Estrogen receptor positive status [ER+]: Secondary | ICD-10-CM | POA: Insufficient documentation

## 2016-09-13 DIAGNOSIS — C50421 Malignant neoplasm of upper-outer quadrant of right male breast: Secondary | ICD-10-CM

## 2016-09-13 DIAGNOSIS — R531 Weakness: Secondary | ICD-10-CM | POA: Insufficient documentation

## 2016-09-13 DIAGNOSIS — Z803 Family history of malignant neoplasm of breast: Secondary | ICD-10-CM | POA: Insufficient documentation

## 2016-09-13 DIAGNOSIS — Z79899 Other long term (current) drug therapy: Secondary | ICD-10-CM | POA: Diagnosis not present

## 2016-09-13 DIAGNOSIS — E785 Hyperlipidemia, unspecified: Secondary | ICD-10-CM | POA: Diagnosis not present

## 2016-09-13 DIAGNOSIS — C50411 Malignant neoplasm of upper-outer quadrant of right female breast: Secondary | ICD-10-CM | POA: Diagnosis not present

## 2016-09-13 DIAGNOSIS — R110 Nausea: Secondary | ICD-10-CM | POA: Diagnosis not present

## 2016-09-13 NOTE — Consult Note (Signed)
NEW PATIENT EVALUATION  Name: Courtney Herrera  MRN: [id number]  Date:   09/13/2016     DOB: Dec 22, 1940   This 76 y.o. female patient presents to the clinic for initial evaluation of stage IA invasive mammary carcinoma of the upper outer quadrant of the right breast status post wide local excision ER PR negative HER-2/neu negative high risk Oncotype DX status post 4 cycles of Cytoxan and Taxotere.  REFERRING PHYSICIAN: Kirk Ruths, MD  CHIEF COMPLAINT:  Chief Complaint  Patient presents with  . Breast Cancer    Initial Evaluation    DIAGNOSIS: The encounter diagnosis was Carcinoma of upper-outer quadrant of right breast in female, unspecified estrogen receptor status ([place]).   PREVIOUS INVESTIGATIONS:  Pathology reports reviewed Mammograms ultrasound reviewed Clinical notes reviewed  HPI: patient is a 76 year old femalewho presented with an abnormal mammogram in the right breast showing 1.1 cm mass in the upper outer quadrant of the right breast with recommendation for biopsy. No abnormal lymph nodes were noted. Patient underwent ultrasound-guided biopsy positive forinvasive mammary carcinoma. Tumor was ER/positive PR negative HER-2/neu not overexpressed.she went on to have a wide local excision showing a 1.1 cm overall grade 2 invasive mammary carcinoma. Margins were clear but close at 0.5 mm. One sentinel lymph node was negative for metastatic disease.inferior margin the time of surgery was close she had reexcision at the time of that surgery with no ltumor seen. She had an Oncotype DX with a score of 44 and underwent 4 cycles of Cytoxan and Taxotere. She tolerated her treatments fairly well although is quite weak and fatigued at this time. She had significant nausea associated with her last cycle of chemotherapy. She is seen today for radiation oncology consultation. She has completed all chemotherapy. She specifically denies breast tenderness cough or bone pain.  PLANNED TREATMENT  REGIMEN: whole breast radiation  PAST MEDICAL HISTORY:  has a past medical history of Breast cancer ([place]) (05/11/2016); Cancer ([place]) (2012); GERD (gastroesophageal reflux disease); and Hyperlipidemia.    PAST SURGICAL HISTORY:  Past Surgical History:  Procedure Laterality Date  . ABDOMINAL HYSTERECTOMY  2012  . BREAST BIOPSY Left    neg  . BREAST BIOPSY Right 05/11/2016   INVASIVE MAMMARY CARCINOMA  . BREAST BIOPSY Bilateral 05/26/2016   bilat stereo path pending  . BREAST CYST ASPIRATION Left    neg  . BREAST EXCISIONAL BIOPSY Right 06/01/2016   + lumpectomy  . BREAST LUMPECTOMY WITH SENTINEL LYMPH NODE BIOPSY Right 06/01/2016   Procedure: BREAST LUMPECTOMY WITH SENTINEL LYMPH NODE BX;  Surgeon: Robert Bellow, MD;  Location: [facility] ORS;  Service: General;  Laterality: Right;    FAMILY HISTORY: family history includes Breast cancer (age of onset: 73) in her sister; Cancer in her brother and sister; Non-Hodgkin's lymphoma in her brother.  SOCIAL HISTORY:  reports that she has never smoked. She has never used smokeless tobacco. She reports that she does not drink alcohol or use drugs.  ALLERGIES: Sulphadimidine [sulfamethazine] and Petrolatum-zinc oxide  MEDICATIONS:  Current Outpatient Prescriptions  Medication Sig Dispense Refill  . acetaminophen (TYLENOL) 500 MG tablet Take 1,000 mg by mouth every 6 (six) hours as needed for mild pain.    Marland Kitchen aspirin 81 MG chewable tablet Chew 81 mg by mouth at bedtime.     Marland Kitchen atorvastatin (LIPITOR) 80 MG tablet Take 80 mg by mouth daily at 6 PM.     . furosemide (LASIX) 20 MG tablet Take 20 mg by mouth daily as needed  for fluid.     . methylPREDNISolone (MEDROL DOSEPAK) 4 MG TBPK tablet Take as directed 21 tablet 0  . niacin (NIASPAN) 500 MG CR tablet Take 1 mg by mouth at bedtime.     . ondansetron (ZOFRAN) 8 MG tablet Take 1 tablet (8 mg total) by mouth 2 (two) times daily as needed for refractory nausea / vomiting. 60 tablet 2  . pantoprazole  (PROTONIX) 40 MG tablet Take 40 mg by mouth every morning.     . prochlorperazine (COMPAZINE) 10 MG tablet Take 1 tablet (10 mg total) by mouth every 6 (six) hours as needed (Nausea or vomiting). 60 tablet 2   No current facility-administered medications for this encounter.     ECOG PERFORMANCE STATUS:  0 - Asymptomatic  REVIEW OF SYSTEMS:  Patient denies any weight loss, fatigue, weakness, fever, chills or night sweats. Patient denies any loss of vision, blurred vision. Patient denies any ringing  of the ears or hearing loss. No irregular heartbeat. Patient denies heart murmur or history of fainting. Patient denies any chest pain or pain radiating to her upper extremities. Patient denies any shortness of breath, difficulty breathing at night, cough or hemoptysis. Patient denies any swelling in the lower legs. Patient denies any nausea vomiting, vomiting of blood, or coffee ground material in the vomitus. Patient denies any stomach pain. Patient states has had normal bowel movements no significant constipation or diarrhea. Patient denies any dysuria, hematuria or significant nocturia. Patient denies any problems walking, swelling in the joints or loss of balance. Patient denies any skin changes, loss of hair or loss of weight. Patient denies any excessive worrying or anxiety or significant depression. Patient denies any problems with insomnia. Patient denies excessive thirst, polyuria, polydipsia. Patient denies any swollen glands, patient denies easy bruising or easy bleeding. Patient denies any recent infections, allergies or URI. Patient "s visual fields have not changed significantly in recent time.    PHYSICAL EXAM: BP (!) 152/93   Pulse 89   Temp 98 F (36.7 C)   Wt 170 lb 11.9 oz (77.4 kg)   BMI 27.56 kg/m  Patient is status post wide local excision of the right breast. Incision is well-healed no dominant mass or nodularity is noted in either breast in 2 positions examined. No axillary or  supraclavicular adenopathy is appreciated. She is a Port-A-Cath in the left anterior chest wall. Her breasts are large and pendulous.  Well-developed well-nourished patient in NAD. HEENT reveals PERLA, EOMI, discs not visualized.  Oral cavity is clear. No oral mucosal lesions are identified. Neck is clear without evidence of cervical or supraclavicular adenopathy. Lungs are clear to A&P. Cardiac examination is essentially unremarkable with regular rate and rhythm without murmur rub or thrill. Abdomen is benign with no organomegaly or masses noted. Motor sensory and DTR levels are equal and symmetric in the upper and lower extremities. Cranial nerves II through XII are grossly intact. Proprioception is intact. No peripheral adenopathy or edema is identified. No motor or sensory levels are noted. Crude visual fields are within normal range.  LABORATORY DATA: Pathology reports reviewed    RADIOLOGY RESULTS: Mammograms and ultrasound reviewed   IMPRESSION: Stage I invasive mammary carcinoma of the right breast as was wide local excision and sentinel node biopsy and adjuvant chemotherapy in 76 year old female  PLAN: Based on her breast size I believe hypofractionated course of treatment would not be indicated. Would plan on delivering 5040 cGy in 28 fractions to her right breast. Would boost  her scar another 1400 100 cGy aced on close margin of less than .5 mm. Risks and benefits of treatment including skin reaction fatigue possible inclusion of superficial lung skin reaction all were discussed in detail with the patient.There will be extra effort by both professional staff as well as technical staff to coordinate and manage concurrent chemoradiation and ensuing side effects during her treatments. I personally 7 ordered CT simulation for next week. Patient seems to comprehend my treatment plan well. She also will be probably a candidate for antiestrogen therapy after completion of treatment.  I would like  to take this opportunity to thank you for allowing me to participate in the care of your patient.Armstead Peaks., MD

## 2016-09-20 ENCOUNTER — Ambulatory Visit
Admission: RE | Admit: 2016-09-20 | Discharge: 2016-09-20 | Disposition: A | Discharge status: Home / Self Care | Payer: Medicare HMO | Source: Ambulatory Visit | Attending: Radiation Oncology | Admitting: Radiation Oncology

## 2016-09-20 DIAGNOSIS — Z510 Encounter for antineoplastic radiation therapy: Secondary | ICD-10-CM | POA: Diagnosis not present

## 2016-09-21 ENCOUNTER — Other Ambulatory Visit: Payer: Self-pay | Admitting: *Deleted

## 2016-09-21 DIAGNOSIS — Z510 Encounter for antineoplastic radiation therapy: Secondary | ICD-10-CM | POA: Diagnosis not present

## 2016-09-21 DIAGNOSIS — C50411 Malignant neoplasm of upper-outer quadrant of right female breast: Secondary | ICD-10-CM

## 2016-09-27 ENCOUNTER — Ambulatory Visit
Admission: RE | Admit: 2016-09-27 | Discharge: 2016-09-27 | Disposition: A | Discharge status: Home / Self Care | Payer: Medicare HMO | Source: Ambulatory Visit | Attending: Radiation Oncology | Admitting: Radiation Oncology

## 2016-09-27 DIAGNOSIS — Z510 Encounter for antineoplastic radiation therapy: Secondary | ICD-10-CM | POA: Diagnosis not present

## 2016-09-28 ENCOUNTER — Ambulatory Visit
Admission: RE | Admit: 2016-09-28 | Discharge: 2016-09-28 | Disposition: A | Discharge status: Home / Self Care | Payer: Medicare HMO | Source: Ambulatory Visit | Attending: Radiation Oncology | Admitting: Radiation Oncology

## 2016-09-28 DIAGNOSIS — Z510 Encounter for antineoplastic radiation therapy: Secondary | ICD-10-CM | POA: Diagnosis not present

## 2016-09-29 ENCOUNTER — Ambulatory Visit
Admission: RE | Admit: 2016-09-29 | Discharge: 2016-09-29 | Disposition: A | Discharge status: Home / Self Care | Payer: Medicare HMO | Source: Ambulatory Visit | Attending: Radiation Oncology | Admitting: Radiation Oncology

## 2016-09-29 DIAGNOSIS — Z510 Encounter for antineoplastic radiation therapy: Secondary | ICD-10-CM | POA: Diagnosis not present

## 2016-10-02 ENCOUNTER — Ambulatory Visit
Admission: RE | Admit: 2016-10-02 | Discharge: 2016-10-02 | Disposition: A | Discharge status: Home / Self Care | Payer: Medicare HMO | Source: Ambulatory Visit | Attending: Radiation Oncology | Admitting: Radiation Oncology

## 2016-10-02 DIAGNOSIS — Z510 Encounter for antineoplastic radiation therapy: Secondary | ICD-10-CM | POA: Diagnosis not present

## 2016-10-03 ENCOUNTER — Ambulatory Visit
Admission: RE | Admit: 2016-10-03 | Discharge: 2016-10-03 | Disposition: A | Discharge status: Home / Self Care | Payer: Medicare HMO | Source: Ambulatory Visit | Attending: Radiation Oncology | Admitting: Radiation Oncology

## 2016-10-03 DIAGNOSIS — Z510 Encounter for antineoplastic radiation therapy: Secondary | ICD-10-CM | POA: Diagnosis not present

## 2016-10-04 ENCOUNTER — Ambulatory Visit
Admission: RE | Admit: 2016-10-04 | Discharge: 2016-10-04 | Disposition: A | Discharge status: Home / Self Care | Payer: Medicare HMO | Source: Ambulatory Visit | Attending: Radiation Oncology | Admitting: Radiation Oncology

## 2016-10-04 DIAGNOSIS — Z510 Encounter for antineoplastic radiation therapy: Secondary | ICD-10-CM | POA: Diagnosis not present

## 2016-10-05 ENCOUNTER — Ambulatory Visit
Admission: RE | Admit: 2016-10-05 | Discharge: 2016-10-05 | Disposition: A | Discharge status: Home / Self Care | Payer: Medicare HMO | Source: Ambulatory Visit | Attending: Radiation Oncology | Admitting: Radiation Oncology

## 2016-10-05 DIAGNOSIS — Z510 Encounter for antineoplastic radiation therapy: Secondary | ICD-10-CM | POA: Diagnosis not present

## 2016-10-06 ENCOUNTER — Ambulatory Visit
Admission: RE | Admit: 2016-10-06 | Discharge: 2016-10-06 | Disposition: A | Discharge status: Home / Self Care | Payer: Medicare HMO | Source: Ambulatory Visit | Attending: Radiation Oncology | Admitting: Radiation Oncology

## 2016-10-06 DIAGNOSIS — Z510 Encounter for antineoplastic radiation therapy: Secondary | ICD-10-CM | POA: Diagnosis not present

## 2016-10-09 ENCOUNTER — Ambulatory Visit
Admission: RE | Admit: 2016-10-09 | Discharge: 2016-10-09 | Disposition: A | Discharge status: Home / Self Care | Payer: Medicare HMO | Source: Ambulatory Visit | Attending: Radiation Oncology | Admitting: Radiation Oncology

## 2016-10-09 DIAGNOSIS — Z510 Encounter for antineoplastic radiation therapy: Secondary | ICD-10-CM | POA: Diagnosis not present

## 2016-10-10 ENCOUNTER — Ambulatory Visit
Admission: RE | Admit: 2016-10-10 | Discharge: 2016-10-10 | Disposition: A | Discharge status: Home / Self Care | Payer: Medicare HMO | Source: Ambulatory Visit | Attending: Radiation Oncology | Admitting: Radiation Oncology

## 2016-10-10 DIAGNOSIS — Z510 Encounter for antineoplastic radiation therapy: Secondary | ICD-10-CM | POA: Diagnosis not present

## 2016-10-11 ENCOUNTER — Ambulatory Visit
Admission: RE | Admit: 2016-10-11 | Discharge: 2016-10-11 | Disposition: A | Discharge status: Home / Self Care | Payer: Medicare HMO | Source: Ambulatory Visit | Attending: Radiation Oncology | Admitting: Radiation Oncology

## 2016-10-11 DIAGNOSIS — Z510 Encounter for antineoplastic radiation therapy: Secondary | ICD-10-CM | POA: Diagnosis not present

## 2016-10-12 ENCOUNTER — Inpatient Hospital Stay: Payer: Medicare HMO | Attending: Radiation Oncology

## 2016-10-12 ENCOUNTER — Ambulatory Visit
Admission: RE | Admit: 2016-10-12 | Discharge: 2016-10-12 | Disposition: A | Discharge status: Home / Self Care | Payer: Medicare HMO | Source: Ambulatory Visit | Attending: Radiation Oncology | Admitting: Radiation Oncology

## 2016-10-12 ENCOUNTER — Ambulatory Visit (INDEPENDENT_AMBULATORY_CARE_PROVIDER_SITE_OTHER): Payer: Medicare HMO | Admitting: General Surgery

## 2016-10-12 ENCOUNTER — Encounter: Payer: Self-pay | Admitting: General Surgery

## 2016-10-12 VITALS — BP 140/78 | HR 82 | Resp 12 | Ht 66.0 in | Wt 174.0 lb

## 2016-10-12 DIAGNOSIS — Z170 Estrogen receptor positive status [ER+]: Secondary | ICD-10-CM | POA: Diagnosis not present

## 2016-10-12 DIAGNOSIS — C50911 Malignant neoplasm of unspecified site of right female breast: Secondary | ICD-10-CM

## 2016-10-12 DIAGNOSIS — C50411 Malignant neoplasm of upper-outer quadrant of right female breast: Secondary | ICD-10-CM | POA: Diagnosis not present

## 2016-10-12 DIAGNOSIS — Z510 Encounter for antineoplastic radiation therapy: Secondary | ICD-10-CM | POA: Diagnosis not present

## 2016-10-12 NOTE — Progress Notes (Signed)
Patient ID: Courtney Herrera, female   DOB: [birth year], 76 y.o.   MRN: [id number]  Chief Complaint  Patient presents with  . Follow-up    HPI Courtney Herrera is a 76 y.o. female.  Here for her 3 month postoperative visit, right lumpectomy done 06-01-16. She states she is doing well. She states her energy level is improving and she has started walking. She has a dry cough and tearing since her last chemotherapy treatment. She is still undergoing radiation therapy. She has noticed a pimple that turns brown right groin at hair line.  HPI  Past Medical History:  Diagnosis Date  . Breast cancer ([place]) 05/11/2016   T1c,N0; ER>90%, PR neg, Her 2 neu negative by FISH. Mammoprint: High risk.   . 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 GERD (gastroesophageal reflux disease)   . Hyperlipidemia     Past Surgical History:  Procedure Laterality Date  . ABDOMINAL HYSTERECTOMY  2012  . BREAST BIOPSY Left    neg  . BREAST BIOPSY Right 05/11/2016   INVASIVE MAMMARY CARCINOMA  . BREAST BIOPSY Bilateral 05/26/2016   bilat stereo path pending  . BREAST CYST ASPIRATION Left    neg  . BREAST EXCISIONAL BIOPSY Right 06/01/2016   + lumpectomy  . BREAST LUMPECTOMY WITH SENTINEL LYMPH NODE BIOPSY Right 06/01/2016   Procedure: BREAST LUMPECTOMY WITH SENTINEL LYMPH NODE BX;  Surgeon: Robert Bellow, MD;  Location: [facility] ORS;  Service: General;  Laterality: Right;    Family History  Problem Relation Age of Onset  . Breast cancer Sister 40  . Cancer Brother        brain  . Cancer Sister   . Non-Hodgkin's lymphoma Brother     Social History Social History  Substance Use Topics  . Smoking status: Never Smoker  . Smokeless tobacco: Never Used  . Alcohol use No    Allergies  Allergen Reactions  . Sulphadimidine [Sulfamethazine] Swelling  . Petrolatum-Zinc Oxide Swelling    Current Outpatient Prescriptions  Medication Sig Dispense Refill  . acetaminophen (TYLENOL) 500 MG tablet Take 1,000 mg  by mouth every 6 (six) hours as needed for mild pain.    Marland Kitchen aspirin 81 MG chewable tablet Chew 81 mg by mouth at bedtime.     . furosemide (LASIX) 20 MG tablet Take 20 mg by mouth daily as needed for fluid.     Marland Kitchen ondansetron (ZOFRAN) 8 MG tablet Take 1 tablet (8 mg total) by mouth 2 (two) times daily as needed for refractory nausea / vomiting. 60 tablet 2  . pantoprazole (PROTONIX) 40 MG tablet Take 40 mg by mouth every morning.     . prochlorperazine (COMPAZINE) 10 MG tablet Take 1 tablet (10 mg total) by mouth every 6 (six) hours as needed (Nausea or vomiting). 60 tablet 2  . atorvastatin (LIPITOR) 80 MG tablet Take 80 mg by mouth daily at 6 PM.     . niacin (NIASPAN) 500 MG CR tablet Take 1 mg by mouth at bedtime.      No current facility-administered medications for this visit.     Review of Systems Review of Systems  Constitutional: Negative.   Respiratory: Positive for cough.   Cardiovascular: Negative.     Blood pressure 140/78, pulse 82, resp. rate 12, height 5\' 6"  (1.676 m), weight 174 lb (78.9 kg).  Physical Exam Physical Exam  Constitutional: She is oriented to person, place, and time. She appears well-developed and well-nourished.  HENT:  Mouth/Throat: Oropharynx is clear and moist.  Eyes: Conjunctivae are normal. No scleral icterus.  Neck: Neck supple.  Cardiovascular: Normal rate, regular rhythm and normal heart sounds.   Left leg lymphedema post uterine cancer  Pulmonary/Chest: Effort normal and breath sounds normal. Right breast exhibits no inverted nipple, no mass, no nipple discharge, no skin change and no tenderness. Left breast exhibits no inverted nipple, no mass, no nipple discharge, no skin change and no tenderness.    Right breast incision is clean and healing well.    Abdominal: Soft. Normal appearance.  4 mm superficial folliculitis right groin  Lymphadenopathy:    She has no cervical adenopathy.    She has no axillary adenopathy.  Neurological: She is  alert and oriented to person, place, and time.  Skin: Skin is warm and dry.  Psychiatric: Her behavior is normal.    Data Reviewed May 2018 medical oncology note.  09/13/2016 radiation oncology note.  At the time of wide excision the previously placed biopsy clip was not in the resected specimen although the invasive mammary carcinoma was resected with negative margins.  Assessment    Doing well post wide excision and adjuvant chemotherapy, one third of the way through her radiation treatment.    Plan    The patient was advised that she may experience some loss of energy as she proceeds into her radiation therapy, and this should rebound quickly when the radiation is completed.  The small skin nodules along the pubic area are likely superficial folliculitis and should resolve spontaneously.    Recommend trying benadryl for sleep as needed.  Patient to have a bilateral diagnostic mammogram follow up in 6 months.   HPI, Physical Exam, Assessment and Plan have been scribed under the direction and in the presence of Robert Bellow, MD. Karie Fetch, RN  I have completed the exam and reviewed the above documentation for accuracy and completeness.  I agree with the above.  Haematologist has been used and any errors in dictation or transcription are unintentional.  Hervey Ard, M.D., F.A.C.S.  Robert Bellow 10/12/2016, 9:46 AM

## 2016-10-12 NOTE — Patient Instructions (Addendum)
The patient is aware to call back for any questions or concerns. Recommend trying benadryl for sleep as needed.

## 2016-10-13 ENCOUNTER — Ambulatory Visit
Admission: RE | Admit: 2016-10-13 | Discharge: 2016-10-13 | Disposition: A | Discharge status: Home / Self Care | Payer: Medicare HMO | Source: Ambulatory Visit | Attending: Radiation Oncology | Admitting: Radiation Oncology

## 2016-10-13 DIAGNOSIS — Z510 Encounter for antineoplastic radiation therapy: Secondary | ICD-10-CM | POA: Diagnosis not present

## 2016-10-16 ENCOUNTER — Ambulatory Visit
Admission: RE | Admit: 2016-10-16 | Discharge: 2016-10-16 | Disposition: A | Discharge status: Home / Self Care | Payer: Medicare HMO | Source: Ambulatory Visit | Attending: Radiation Oncology | Admitting: Radiation Oncology

## 2016-10-16 DIAGNOSIS — Z510 Encounter for antineoplastic radiation therapy: Secondary | ICD-10-CM | POA: Diagnosis not present

## 2016-10-16 NOTE — Progress Notes (Signed)
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 High risk Oncotype with recurrence score of 44.  INTERVAL HISTORY: Patient returns to clinic today for further evaluation and discussion of initiating aromatase inhibitor. She is currently receiving adjuvant XRT and tolerating it well. She currently feels well and is asymptomatic. She has no neurologic complaints. She denies any recent fevers or illnesses. She has a good appetite and denies weight loss. She has no chest pain or shortness of breath. She denies any nausea, vomiting, constipation, or diarrhea. She has no urinary complaints. Patient offers no specific complaints today.  REVIEW OF SYSTEMS:   Review of Systems  Constitutional: Negative.  Negative for fever, malaise/fatigue and weight loss.  Respiratory: Negative.  Negative for cough and shortness of breath.   Cardiovascular: Negative.  Negative for chest pain and leg swelling.  Gastrointestinal: Negative.  Negative for abdominal pain.  Genitourinary: Negative.   Musculoskeletal: Negative.   Skin: Negative.  Negative for rash.  Neurological: Negative.  Negative for weakness.  Psychiatric/Behavioral: Negative.  The patient is not nervous/anxious.     As per HPI. Otherwise, a complete review of systems is negative.  PAST MEDICAL HISTORY: Past Medical History:  Diagnosis Date  . Breast cancer ([place]) 05/11/2016   T1c,N0; ER>90%, PR neg, Her 2 neu negative by FISH. Mammoprint: High risk.   . Cancer [facility]) 2012   uterine- radiation  . GERD (gastroesophageal reflux disease)   .  Hyperlipidemia     PAST SURGICAL HISTORY: Past Surgical History:  Procedure Laterality Date  . ABDOMINAL HYSTERECTOMY  2012  . BREAST BIOPSY Left    neg  . BREAST BIOPSY Right 05/11/2016   INVASIVE MAMMARY CARCINOMA  . BREAST BIOPSY Bilateral 05/26/2016   bilat stereo path pending  . BREAST CYST ASPIRATION Left    neg  . BREAST EXCISIONAL BIOPSY Right 06/01/2016   + lumpectomy  . BREAST LUMPECTOMY WITH SENTINEL LYMPH NODE BIOPSY Right 06/01/2016   Procedure: BREAST LUMPECTOMY WITH SENTINEL LYMPH NODE BX;  Surgeon: Robert Bellow, MD;  Location: [facility] ORS;  Service: General;  Laterality: Right;    FAMILY HISTORY: Family History  Problem Relation Age of Onset  . Breast cancer Sister 70  . Cancer Brother        brain  . Cancer Sister   . Non-Hodgkin's lymphoma Brother     ADVANCED DIRECTIVES (Y/N):  N  HEALTH MAINTENANCE: Social History  Substance Use Topics  . Smoking status: Never Smoker  . Smokeless tobacco: Never Used  . Alcohol use No     Colonoscopy:  PAP:  Bone density:  Lipid panel:  Allergies  Allergen Reactions  . Sulphadimidine [Sulfamethazine] Swelling  . Petrolatum-Zinc Oxide Swelling    Current Outpatient Prescriptions  Medication Sig Dispense Refill  . acetaminophen (TYLENOL) 500 MG tablet Take 1,000 mg by mouth every 6 (six) hours as needed for mild pain.    Marland Kitchen aspirin 81 MG chewable tablet Chew 81 mg by mouth at bedtime.     . furosemide (LASIX) 20 MG tablet Take 20 mg by mouth daily as needed for fluid.     Marland Kitchen ondansetron (ZOFRAN) 8  MG tablet Take 1 tablet (8 mg total) by mouth 2 (two) times daily as needed for refractory nausea / vomiting. 60 tablet 2  . pantoprazole (PROTONIX) 40 MG tablet Take 40 mg by mouth every morning.     . prochlorperazine (COMPAZINE) 10 MG tablet Take 1 tablet (10 mg total) by mouth every 6 (six) hours as needed (Nausea or vomiting). 60 tablet 2  . atorvastatin (LIPITOR) 80 MG tablet Take 80 mg by mouth daily at 6  PM.     . letrozole (FEMARA) 2.5 MG tablet Take 1 tablet (2.5 mg total) by mouth daily. 90 tablet 3  . niacin (NIASPAN) 500 MG CR tablet Take 1 mg by mouth at bedtime.      No current facility-administered medications for this visit.     OBJECTIVE: Vitals:   10/17/16 1435  BP: 133/80  Pulse: 77  Resp: 20  Temp: (!) 96.8 F (36 C)     Body mass index is 28.33 kg/m.    ECOG FS:0 - Asymptomatic  General: Well-developed, well-nourished, no acute distress. Eyes: Pink conjunctiva, anicteric sclera. Breasts: Well healing surgical scar on right breast. Exam deferred today. Lungs: Clear to auscultation bilaterally. Heart: Regular rate and rhythm. No rubs, murmurs, or gallops. Abdomen: Soft, nontender, nondistended. No organomegaly noted, normoactive bowel sounds. Musculoskeletal: No edema, cyanosis, or clubbing. Neuro: Alert, answering all questions appropriately. Cranial nerves grossly intact. Skin: No rashes or petechiae noted. Psych: Normal affect.   LAB RESULTS:  Lab Results  Component Value Date   NA 139 10/17/2016   K 4.4 10/17/2016   CL 105 10/17/2016   CO2 27 10/17/2016   GLUCOSE 110 (H) 10/17/2016   BUN 28 (H) 10/17/2016   CREATININE 0.79 10/17/2016   CALCIUM 8.9 10/17/2016   PROT 6.3 (L) 10/17/2016   ALBUMIN 3.7 10/17/2016   AST 14 (L) 10/17/2016   ALT 13 (L) 10/17/2016   ALKPHOS 64 10/17/2016   BILITOT 0.5 10/17/2016   GFRNONAA >60 10/17/2016   GFRAA >60 10/17/2016    Lab Results  Component Value Date   WBC 3.3 (L) 10/17/2016   NEUTROABS 3.1 09/05/2016   HGB 10.9 (L) 10/17/2016   HCT 32.2 (L) 10/17/2016   MCV 89.4 10/17/2016   PLT 159 10/17/2016     STUDIES: No results found.  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 High risk Oncotype with recurrence score of 44.   PLAN:    1. Pathologic stage Ia ER positive, PR negative, HER-2 negative invasive  carcinoma of the upper outer quadrant of the right breast: Patient was noted to have a Oncotype DX recurrence score 44 which is considered high risk. Her 10 year risk of distant recurrence with tamoxifen alone is approximately 30%, therefore have recommended adjuvant chemotherapy using Taxotere and Cytoxan. Patient completed treatment on Sep 05, 2016. She is currently undergoing adjuvant XRT and tolerating it well. Patient was given a prescription for letrozole which she was instructed not to initiated to the conclusion of XRT in August. She will take this for 5 years completing treatment in August 2023. We will also get a baseline bone mineral density in the next month. Return to clinic in 4 months for further evaluation.  2. Leukopenia: Mild, monitor. 3. Anemia: Mild, monitor.  Approximately 30 minutes was spent in discussion of which greater than 50% was consultation.  Patient expressed understanding and was in agreement with this plan. She  also understands that She can call clinic at any time with any questions, concerns, or complaints.   Cancer Staging Primary cancer of upper outer quadrant of right female breast ([place]) HER 2 NEGATIVE  (FISH) Staging form: Breast, AJCC 8th Edition - Pathologic stage from 06/12/2016: Stage IA (pT1c, pN0, cM0, G2, ER: Positive, PR: Negative, HER2: Negative) - Signed by Lloyd Huger, MD on 06/12/2016   Lloyd Huger, MD   10/18/2016 7:21 AM

## 2016-10-17 ENCOUNTER — Inpatient Hospital Stay (HOSPITAL_BASED_OUTPATIENT_CLINIC_OR_DEPARTMENT_OTHER): Payer: Medicare HMO | Admitting: Oncology

## 2016-10-17 ENCOUNTER — Ambulatory Visit
Admission: RE | Admit: 2016-10-17 | Discharge: 2016-10-17 | Disposition: A | Discharge status: Home / Self Care | Payer: Medicare HMO | Source: Ambulatory Visit | Attending: Radiation Oncology | Admitting: Radiation Oncology

## 2016-10-17 ENCOUNTER — Inpatient Hospital Stay: Payer: Medicare HMO | Attending: Oncology

## 2016-10-17 VITALS — BP 133/80 | HR 77 | Temp 96.8°F | Resp 20 | Wt 175.5 lb

## 2016-10-17 DIAGNOSIS — K219 Gastro-esophageal reflux disease without esophagitis: Secondary | ICD-10-CM | POA: Insufficient documentation

## 2016-10-17 DIAGNOSIS — Z79811 Long term (current) use of aromatase inhibitors: Secondary | ICD-10-CM

## 2016-10-17 DIAGNOSIS — Z803 Family history of malignant neoplasm of breast: Secondary | ICD-10-CM | POA: Insufficient documentation

## 2016-10-17 DIAGNOSIS — Z170 Estrogen receptor positive status [ER+]: Secondary | ICD-10-CM

## 2016-10-17 DIAGNOSIS — Z9221 Personal history of antineoplastic chemotherapy: Secondary | ICD-10-CM | POA: Insufficient documentation

## 2016-10-17 DIAGNOSIS — D72819 Decreased white blood cell count, unspecified: Secondary | ICD-10-CM | POA: Insufficient documentation

## 2016-10-17 DIAGNOSIS — Z807 Family history of other malignant neoplasms of lymphoid, hematopoietic and related tissues: Secondary | ICD-10-CM

## 2016-10-17 DIAGNOSIS — C50411 Malignant neoplasm of upper-outer quadrant of right female breast: Secondary | ICD-10-CM

## 2016-10-17 DIAGNOSIS — Z808 Family history of malignant neoplasm of other organs or systems: Secondary | ICD-10-CM | POA: Insufficient documentation

## 2016-10-17 DIAGNOSIS — Z79899 Other long term (current) drug therapy: Secondary | ICD-10-CM | POA: Insufficient documentation

## 2016-10-17 DIAGNOSIS — Z510 Encounter for antineoplastic radiation therapy: Secondary | ICD-10-CM | POA: Diagnosis not present

## 2016-10-17 DIAGNOSIS — D649 Anemia, unspecified: Secondary | ICD-10-CM

## 2016-10-17 DIAGNOSIS — E785 Hyperlipidemia, unspecified: Secondary | ICD-10-CM

## 2016-10-17 DIAGNOSIS — Z7982 Long term (current) use of aspirin: Secondary | ICD-10-CM | POA: Diagnosis not present

## 2016-10-17 LAB — COMPREHENSIVE METABOLIC PANEL
ALBUMIN: 3.7 g/dL (ref 3.5–5.0)
ALK PHOS: 64 U/L (ref 38–126)
ALT: 13 U/L — ABNORMAL LOW (ref 14–54)
AST: 14 U/L — AB (ref 15–41)
Anion gap: 7 (ref 5–15)
BILIRUBIN TOTAL: 0.5 mg/dL (ref 0.3–1.2)
BUN: 28 mg/dL — AB (ref 6–20)
CALCIUM: 8.9 mg/dL (ref 8.9–10.3)
CO2: 27 mmol/L (ref 22–32)
CREATININE: 0.79 mg/dL (ref 0.44–1.00)
Chloride: 105 mmol/L (ref 101–111)
GFR calc Af Amer: >60 mL/min (ref >60)
GFR calc non Af Amer: >60 mL/min (ref >60)
GLUCOSE: 110 mg/dL — AB (ref 65–99)
Potassium: 4.4 mmol/L (ref 3.5–5.1)
Sodium: 139 mmol/L (ref 135–145)
TOTAL PROTEIN: 6.3 g/dL — AB (ref 6.5–8.1)

## 2016-10-17 LAB — CBC
HEMATOCRIT: 32.2 % — AB (ref 35.0–47.0)
HEMOGLOBIN: 10.9 g/dL — AB (ref 12.0–16.0)
MCH: 30.3 pg (ref 26.0–34.0)
MCHC: 33.9 g/dL (ref 32.0–36.0)
MCV: 89.4 fL (ref 80.0–100.0)
Platelets: 159 10*3/uL (ref 150–440)
RBC: 3.6 MIL/uL — ABNORMAL LOW (ref 3.80–5.20)
RDW: 15.6 % — ABNORMAL HIGH (ref 11.5–14.5)
WBC: 3.3 10*3/uL — AB (ref 3.6–11.0)

## 2016-10-17 MED ORDER — LETROZOLE 2.5 MG PO TABS: 2.5000 mg | ORAL_TABLET | Freq: Every day | ORAL | 3 refills | Status: DC

## 2016-10-17 NOTE — Progress Notes (Signed)
Patient denies any concerns today.  

## 2016-10-19 ENCOUNTER — Ambulatory Visit
Admission: RE | Admit: 2016-10-19 | Discharge: 2016-10-19 | Disposition: A | Discharge status: Home / Self Care | Payer: Medicare HMO | Source: Ambulatory Visit | Attending: Radiation Oncology | Admitting: Radiation Oncology

## 2016-10-19 DIAGNOSIS — Z510 Encounter for antineoplastic radiation therapy: Secondary | ICD-10-CM | POA: Diagnosis not present

## 2016-10-20 ENCOUNTER — Ambulatory Visit
Admission: RE | Admit: 2016-10-20 | Discharge: 2016-10-20 | Disposition: A | Discharge status: Home / Self Care | Payer: Medicare HMO | Source: Ambulatory Visit | Attending: Radiation Oncology | Admitting: Radiation Oncology

## 2016-10-20 DIAGNOSIS — Z510 Encounter for antineoplastic radiation therapy: Secondary | ICD-10-CM | POA: Diagnosis not present

## 2016-10-23 ENCOUNTER — Ambulatory Visit
Admission: RE | Admit: 2016-10-23 | Discharge: 2016-10-23 | Disposition: A | Discharge status: Home / Self Care | Payer: Medicare HMO | Source: Ambulatory Visit | Attending: Radiation Oncology | Admitting: Radiation Oncology

## 2016-10-23 DIAGNOSIS — Z510 Encounter for antineoplastic radiation therapy: Secondary | ICD-10-CM | POA: Diagnosis not present

## 2016-10-24 ENCOUNTER — Ambulatory Visit
Admission: RE | Admit: 2016-10-24 | Discharge: 2016-10-24 | Disposition: A | Discharge status: Home / Self Care | Payer: Medicare HMO | Source: Ambulatory Visit | Attending: Radiation Oncology | Admitting: Radiation Oncology

## 2016-10-24 DIAGNOSIS — Z510 Encounter for antineoplastic radiation therapy: Secondary | ICD-10-CM | POA: Diagnosis not present

## 2016-10-25 ENCOUNTER — Ambulatory Visit
Admission: RE | Admit: 2016-10-25 | Discharge: 2016-10-25 | Disposition: A | Discharge status: Home / Self Care | Payer: Medicare HMO | Source: Ambulatory Visit | Attending: Radiation Oncology | Admitting: Radiation Oncology

## 2016-10-25 DIAGNOSIS — Z510 Encounter for antineoplastic radiation therapy: Secondary | ICD-10-CM | POA: Diagnosis not present

## 2016-10-26 ENCOUNTER — Ambulatory Visit
Admission: RE | Admit: 2016-10-26 | Discharge: 2016-10-26 | Disposition: A | Discharge status: Home / Self Care | Payer: Medicare HMO | Source: Ambulatory Visit | Attending: Radiation Oncology | Admitting: Radiation Oncology

## 2016-10-26 ENCOUNTER — Inpatient Hospital Stay: Payer: Medicare HMO

## 2016-10-26 DIAGNOSIS — Z510 Encounter for antineoplastic radiation therapy: Secondary | ICD-10-CM | POA: Diagnosis not present

## 2016-10-27 ENCOUNTER — Ambulatory Visit
Admission: RE | Admit: 2016-10-27 | Discharge: 2016-10-27 | Disposition: A | Discharge status: Home / Self Care | Payer: Medicare HMO | Source: Ambulatory Visit | Attending: Radiation Oncology | Admitting: Radiation Oncology

## 2016-10-27 DIAGNOSIS — Z510 Encounter for antineoplastic radiation therapy: Secondary | ICD-10-CM | POA: Diagnosis not present

## 2016-10-30 ENCOUNTER — Ambulatory Visit
Admission: RE | Admit: 2016-10-30 | Discharge: 2016-10-30 | Disposition: A | Discharge status: Home / Self Care | Payer: Medicare HMO | Source: Ambulatory Visit | Attending: Radiation Oncology | Admitting: Radiation Oncology

## 2016-10-30 DIAGNOSIS — Z510 Encounter for antineoplastic radiation therapy: Secondary | ICD-10-CM | POA: Diagnosis not present

## 2016-10-31 ENCOUNTER — Other Ambulatory Visit: Payer: Self-pay | Admitting: *Deleted

## 2016-10-31 ENCOUNTER — Ambulatory Visit
Admission: RE | Admit: 2016-10-31 | Discharge: 2016-10-31 | Disposition: A | Discharge status: Home / Self Care | Payer: Medicare HMO | Source: Ambulatory Visit | Attending: Radiation Oncology | Admitting: Radiation Oncology

## 2016-10-31 DIAGNOSIS — Z510 Encounter for antineoplastic radiation therapy: Secondary | ICD-10-CM | POA: Diagnosis not present

## 2016-10-31 MED ORDER — SILVER SULFADIAZINE 1 % EX CREA
1.0000 | TOPICAL_CREAM | Freq: Every day | CUTANEOUS | 0 refills | Status: DC
Start: 2016-10-31 — End: 2017-01-25

## 2016-11-01 ENCOUNTER — Ambulatory Visit
Admission: RE | Admit: 2016-11-01 | Discharge: 2016-11-01 | Disposition: A | Discharge status: Home / Self Care | Payer: Medicare HMO | Source: Ambulatory Visit | Attending: Radiation Oncology | Admitting: Radiation Oncology

## 2016-11-01 DIAGNOSIS — Z510 Encounter for antineoplastic radiation therapy: Secondary | ICD-10-CM | POA: Diagnosis not present

## 2016-11-02 ENCOUNTER — Ambulatory Visit
Admission: RE | Admit: 2016-11-02 | Discharge: 2016-11-02 | Disposition: A | Discharge status: Home / Self Care | Payer: Medicare HMO | Source: Ambulatory Visit | Attending: Radiation Oncology | Admitting: Radiation Oncology

## 2016-11-02 DIAGNOSIS — Z510 Encounter for antineoplastic radiation therapy: Secondary | ICD-10-CM | POA: Diagnosis not present

## 2016-11-03 ENCOUNTER — Ambulatory Visit
Admission: RE | Admit: 2016-11-03 | Discharge: 2016-11-03 | Disposition: A | Discharge status: Home / Self Care | Payer: Medicare HMO | Source: Ambulatory Visit | Attending: Radiation Oncology | Admitting: Radiation Oncology

## 2016-11-03 DIAGNOSIS — Z510 Encounter for antineoplastic radiation therapy: Secondary | ICD-10-CM | POA: Diagnosis not present

## 2016-11-06 ENCOUNTER — Ambulatory Visit
Admission: RE | Admit: 2016-11-06 | Discharge: 2016-11-06 | Disposition: A | Discharge status: Home / Self Care | Payer: Medicare HMO | Source: Ambulatory Visit | Attending: Radiation Oncology | Admitting: Radiation Oncology

## 2016-11-06 DIAGNOSIS — Z510 Encounter for antineoplastic radiation therapy: Secondary | ICD-10-CM | POA: Diagnosis not present

## 2016-11-07 ENCOUNTER — Ambulatory Visit
Admission: RE | Admit: 2016-11-07 | Discharge: 2016-11-07 | Disposition: A | Discharge status: Home / Self Care | Payer: Medicare HMO | Source: Ambulatory Visit | Attending: Radiation Oncology | Admitting: Radiation Oncology

## 2016-11-07 DIAGNOSIS — Z510 Encounter for antineoplastic radiation therapy: Secondary | ICD-10-CM | POA: Diagnosis not present

## 2016-11-08 ENCOUNTER — Ambulatory Visit
Admission: RE | Admit: 2016-11-08 | Discharge: 2016-11-08 | Disposition: A | Discharge status: Home / Self Care | Payer: Medicare HMO | Source: Ambulatory Visit | Attending: Radiation Oncology | Admitting: Radiation Oncology

## 2016-11-08 DIAGNOSIS — Z510 Encounter for antineoplastic radiation therapy: Secondary | ICD-10-CM | POA: Diagnosis not present

## 2016-11-09 ENCOUNTER — Inpatient Hospital Stay: Payer: Medicare HMO

## 2016-11-09 ENCOUNTER — Ambulatory Visit
Admission: RE | Admit: 2016-11-09 | Discharge: 2016-11-09 | Disposition: A | Discharge status: Home / Self Care | Payer: Medicare HMO | Source: Ambulatory Visit | Attending: Radiation Oncology | Admitting: Radiation Oncology

## 2016-11-09 DIAGNOSIS — C50411 Malignant neoplasm of upper-outer quadrant of right female breast: Secondary | ICD-10-CM

## 2016-11-09 DIAGNOSIS — Z510 Encounter for antineoplastic radiation therapy: Secondary | ICD-10-CM | POA: Diagnosis not present

## 2016-11-09 LAB — CBC
HEMATOCRIT: 35.7 % (ref 35.0–47.0)
Hemoglobin: 12.1 g/dL (ref 12.0–16.0)
MCH: 30.3 pg (ref 26.0–34.0)
MCHC: 33.9 g/dL (ref 32.0–36.0)
MCV: 89.3 fL (ref 80.0–100.0)
PLATELETS: 173 10*3/uL (ref 150–440)
RBC: 4 MIL/uL (ref 3.80–5.20)
RDW: 13.7 % (ref 11.5–14.5)
WBC: 3.4 10*3/uL — AB (ref 3.6–11.0)

## 2016-11-10 ENCOUNTER — Ambulatory Visit
Admission: RE | Admit: 2016-11-10 | Discharge: 2016-11-10 | Disposition: A | Discharge status: Home / Self Care | Payer: Medicare HMO | Source: Ambulatory Visit | Attending: Radiation Oncology | Admitting: Radiation Oncology

## 2016-11-10 DIAGNOSIS — Z510 Encounter for antineoplastic radiation therapy: Secondary | ICD-10-CM | POA: Diagnosis not present

## 2016-11-13 ENCOUNTER — Ambulatory Visit
Admission: RE | Admit: 2016-11-13 | Discharge: 2016-11-13 | Disposition: A | Discharge status: Home / Self Care | Payer: Medicare HMO | Source: Ambulatory Visit | Attending: Radiation Oncology | Admitting: Radiation Oncology

## 2016-11-13 DIAGNOSIS — Z510 Encounter for antineoplastic radiation therapy: Secondary | ICD-10-CM | POA: Diagnosis not present

## 2016-11-14 ENCOUNTER — Ambulatory Visit
Admission: RE | Admit: 2016-11-14 | Discharge: 2016-11-14 | Disposition: A | Discharge status: Home / Self Care | Payer: Medicare HMO | Source: Ambulatory Visit | Attending: Radiation Oncology | Admitting: Radiation Oncology

## 2016-11-14 DIAGNOSIS — Z510 Encounter for antineoplastic radiation therapy: Secondary | ICD-10-CM | POA: Diagnosis not present

## 2016-11-15 ENCOUNTER — Ambulatory Visit
Admission: RE | Admit: 2016-11-15 | Discharge: 2016-11-15 | Disposition: A | Discharge status: Home / Self Care | Payer: Medicare HMO | Source: Ambulatory Visit | Attending: Radiation Oncology | Admitting: Radiation Oncology

## 2016-11-15 DIAGNOSIS — Z510 Encounter for antineoplastic radiation therapy: Secondary | ICD-10-CM | POA: Diagnosis not present

## 2016-11-16 ENCOUNTER — Ambulatory Visit
Admission: RE | Admit: 2016-11-16 | Discharge: 2016-11-16 | Disposition: A | Discharge status: Home / Self Care | Payer: Medicare HMO | Source: Ambulatory Visit | Attending: Radiation Oncology | Admitting: Radiation Oncology

## 2016-11-16 DIAGNOSIS — Z510 Encounter for antineoplastic radiation therapy: Secondary | ICD-10-CM | POA: Diagnosis not present

## 2016-11-17 ENCOUNTER — Ambulatory Visit
Admission: RE | Admit: 2016-11-17 | Discharge: 2016-11-17 | Disposition: A | Discharge status: Home / Self Care | Payer: Medicare HMO | Source: Ambulatory Visit | Attending: Radiation Oncology | Admitting: Radiation Oncology

## 2016-11-17 DIAGNOSIS — Z510 Encounter for antineoplastic radiation therapy: Secondary | ICD-10-CM | POA: Diagnosis not present

## 2016-11-30 ENCOUNTER — Ambulatory Visit
Admission: RE | Admit: 2016-11-30 | Discharge: 2016-11-30 | Disposition: A | Discharge status: Home / Self Care | Payer: Medicare HMO | Source: Ambulatory Visit | Attending: Oncology | Admitting: Oncology

## 2016-11-30 DIAGNOSIS — C50411 Malignant neoplasm of upper-outer quadrant of right female breast: Secondary | ICD-10-CM | POA: Diagnosis present

## 2016-11-30 DIAGNOSIS — M8588 Other specified disorders of bone density and structure, other site: Secondary | ICD-10-CM | POA: Insufficient documentation

## 2016-12-22 ENCOUNTER — Ambulatory Visit
Admission: RE | Admit: 2016-12-22 | Discharge: 2016-12-22 | Disposition: A | Discharge status: Home / Self Care | Payer: Medicare HMO | Source: Ambulatory Visit | Attending: Radiation Oncology | Admitting: Radiation Oncology

## 2016-12-22 ENCOUNTER — Encounter: Payer: Self-pay | Admitting: Radiation Oncology

## 2016-12-22 VITALS — BP 136/81 | HR 69 | Temp 95.7°F | Resp 20 | Wt 176.3 lb

## 2016-12-22 DIAGNOSIS — Z170 Estrogen receptor positive status [ER+]: Principal | ICD-10-CM

## 2016-12-22 DIAGNOSIS — C50411 Malignant neoplasm of upper-outer quadrant of right female breast: Secondary | ICD-10-CM

## 2017-01-25 ENCOUNTER — Inpatient Hospital Stay: Payer: Self-pay

## 2017-01-25 ENCOUNTER — Ambulatory Visit (INDEPENDENT_AMBULATORY_CARE_PROVIDER_SITE_OTHER): Payer: Medicare HMO | Admitting: General Surgery

## 2017-01-25 ENCOUNTER — Encounter: Payer: Self-pay | Admitting: General Surgery

## 2017-01-25 VITALS — BP 164/98 | HR 78 | Resp 12 | Ht 66.0 in | Wt 179.0 lb

## 2017-01-25 DIAGNOSIS — N6489 Other specified disorders of breast: Secondary | ICD-10-CM | POA: Diagnosis not present

## 2017-01-25 DIAGNOSIS — N63 Unspecified lump in unspecified breast: Secondary | ICD-10-CM

## 2017-01-25 MED ORDER — CEFADROXIL 500 MG PO CAPS: 500.0000 mg | ORAL_CAPSULE | Freq: Two times a day (BID) | ORAL | 0 refills | Status: DC

## 2017-01-25 NOTE — Patient Instructions (Addendum)
May try Aleve twice a day and follow up Monday The patient is aware to use a heating pad as needed for comfort. Call office if symptoms worsen over weekend.

## 2017-01-25 NOTE — Progress Notes (Signed)
Patient ID: Courtney Herrera, female   DOB: 1940/07/20, 76 y.o.   MRN: [id number]  Chief Complaint  Patient presents with  . Other    HPI Courtney Herrera is a 76 y.o. female.  Here for follow up right breast cancer. She is just "not feeling well today". Patient completed radiation therapy 11/17/2016. Previously completed adjuvant chemotherapy.  She is complaining of constant right breast pain that started Sunday morning. Prior to that she had been cleaning her house and cleaning window and working in the yard. Tylenol has not helped.  HPI  Past Medical History:  Diagnosis Date  . Breast cancer ([place]) 05/11/2016   T1c,N0; ER>90%, PR neg, Her 2 neu negative by FISH. Mammoprint: High risk.   . Cancer [facility]) 2012   uterine- radiation  . GERD (gastroesophageal reflux disease)   . Hyperlipidemia     Past Surgical History:  Procedure Laterality Date  . ABDOMINAL HYSTERECTOMY  2012  . BREAST BIOPSY Left    neg  . BREAST BIOPSY Right 05/11/2016   INVASIVE MAMMARY CARCINOMA  . BREAST BIOPSY Bilateral 05/26/2016   bilat stereo path pending  . BREAST CYST ASPIRATION Left    neg  . BREAST EXCISIONAL BIOPSY Right 06/01/2016   + lumpectomy  . BREAST LUMPECTOMY WITH SENTINEL LYMPH NODE BIOPSY Right 06/01/2016   Procedure: BREAST LUMPECTOMY WITH SENTINEL LYMPH NODE BX;  Surgeon: Robert Bellow, MD;  Location: [facility] ORS;  Service: General;  Laterality: Right;    Family History  Problem Relation Age of Onset  . Breast cancer Sister 61  . Cancer Brother        brain  . Cancer Sister   . Non-Hodgkin's lymphoma Brother     Social History Social History  Substance Use Topics  . Smoking status: Never Smoker  . Smokeless tobacco: Never Used  . Alcohol use No    Allergies  Allergen Reactions  . Sulphadimidine [Sulfamethazine] Swelling  . Petrolatum-Zinc Oxide Swelling    Current Outpatient Prescriptions  Medication Sig Dispense Refill  . aspirin 81 MG chewable tablet Chew 81 mg by  mouth at bedtime.     . carvedilol (COREG) 6.25 MG tablet Take by mouth.    . furosemide (LASIX) 20 MG tablet Take 20 mg by mouth daily as needed for fluid.     Marland Kitchen letrozole (FEMARA) 2.5 MG tablet Take 1 tablet (2.5 mg total) by mouth daily. 90 tablet 3  . pantoprazole (PROTONIX) 40 MG tablet Take 40 mg by mouth every morning.     Marland Kitchen atorvastatin (LIPITOR) 80 MG tablet Take 80 mg by mouth daily at 6 PM.     . cefadroxil (DURICEF) 500 MG capsule Take 1 capsule (500 mg total) by mouth 2 (two) times daily. 20 capsule 0  . niacin (NIASPAN) 500 MG CR tablet Take 1 mg by mouth at bedtime.      No current facility-administered medications for this visit.     Review of Systems Review of Systems  Constitutional: Negative.   Respiratory: Negative.   Cardiovascular: Negative.     Blood pressure (!) 164/98, pulse 78, resp. rate 12, height 5\' 6"  (1.676 m), weight 179 lb (81.2 kg), SpO2 97 %.  Physical Exam Physical Exam  Constitutional: She is oriented to person, place, and time. She appears well-developed and well-nourished.  HENT:  Mouth/Throat: Oropharynx is clear and moist.  Eyes: Conjunctivae are normal. No scleral icterus.  Neck: Neck supple.  Cardiovascular: Normal rate, regular rhythm and normal heart  sounds.   Pulmonary/Chest: Effort normal and breath sounds normal. Right breast exhibits skin change. Right breast exhibits no inverted nipple, no mass, no nipple discharge and no tenderness. Left breast exhibits no inverted nipple, no mass, no nipple discharge, no skin change and no tenderness.    Right breast with marked redness and swelling  Lymphadenopathy:    She has no cervical adenopathy.    She has no axillary adenopathy.  Neurological: She is alert and oriented to person, place, and time.  Skin: Skin is warm and dry.  Psychiatric: Her behavior is normal.    Data Reviewed Ultrasound examination of the right breast was undertaken due to the marked change since her last visit.  Evidence of a significant seroma measuring 2.25 x 2.42 x 4.5 cm was noted. The patient was amenable to aspiration. ChloraPrep was applied to the skin followed by 1 mL of 1% plain Xylocaine. Approximately 30 mL of slightly turbid, pale straw-colored fluid was noted. No odor.   Assessment    Possible infected seroma versus inflammation secondary to mechanical trauma.     Plan    The patient will be placed on Duricef 500 mg by mouth twice a day. Local heat for comfort taking care to avoid burning the surgical site.     May try Aleve twice a day and follow up Monday The patient is aware to use a heating pad as needed for comfort. Call office if symptoms worsen over weekend.   HPI, Physical Exam, Assessment and Plan have been scribed under the direction and in the presence of Robert Bellow, MD. Karie Fetch, RN  I have completed the exam and reviewed the above documentation for accuracy and completeness.  I agree with the above.  Haematologist has been used and any errors in dictation or transcription are unintentional.  Hervey Ard, M.D., F.A.C.S.  Robert Bellow 01/25/2017, 8:18 PM

## 2017-01-29 ENCOUNTER — Ambulatory Visit (INDEPENDENT_AMBULATORY_CARE_PROVIDER_SITE_OTHER): Payer: Medicare HMO | Admitting: General Surgery

## 2017-01-29 ENCOUNTER — Encounter: Payer: Self-pay | Admitting: General Surgery

## 2017-01-29 VITALS — BP 144/82 | HR 77 | Resp 14 | Ht 66.0 in | Wt 179.0 lb

## 2017-01-29 DIAGNOSIS — N6489 Other specified disorders of breast: Secondary | ICD-10-CM

## 2017-01-29 LAB — ANAEROBIC AND AEROBIC CULTURE

## 2017-01-29 NOTE — Progress Notes (Signed)
Patient ID: Courtney Herrera, female   DOB: Sep 07, 1940, 76 y.o.   MRN: [id number]  Chief Complaint  Patient presents with  . Follow-up    HPI Courtney Herrera is a 76 y.o. female here for follow up right breast cancer. She still has a lot of tenderness in the right breast with occasional shooting pains. The pain has improved. She is still on her antibiotics and taking Aleve twice daily.   HPI  Past Medical History:  Diagnosis Date  . Breast cancer ([place]) 05/11/2016   T1c,N0; ER>90%, PR neg, Her 2 neu negative by FISH. Mammoprint: High risk.   . 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 GERD (gastroesophageal reflux disease)   . Hyperlipidemia     Past Surgical History:  Procedure Laterality Date  . ABDOMINAL HYSTERECTOMY  2012  . BREAST BIOPSY Left    neg  . BREAST BIOPSY Right 05/11/2016   INVASIVE MAMMARY CARCINOMA  . BREAST BIOPSY Bilateral 05/26/2016   bilat stereo path pending  . BREAST CYST ASPIRATION Left    neg  . BREAST EXCISIONAL BIOPSY Right 06/01/2016   + lumpectomy  . BREAST LUMPECTOMY WITH SENTINEL LYMPH NODE BIOPSY Right 06/01/2016   Procedure: BREAST LUMPECTOMY WITH SENTINEL LYMPH NODE BX;  Surgeon: Robert Bellow, MD;  Location: [facility] ORS;  Service: General;  Laterality: Right;    Family History  Problem Relation Age of Onset  . Breast cancer Sister 65  . Cancer Brother        brain  . Cancer Sister   . Non-Hodgkin's lymphoma Brother     Social History Social History  Substance Use Topics  . Smoking status: Never Smoker  . Smokeless tobacco: Never Used  . Alcohol use No    Allergies  Allergen Reactions  . Sulphadimidine [Sulfamethazine] Swelling  . Petrolatum-Zinc Oxide Swelling    Current Outpatient Prescriptions  Medication Sig Dispense Refill  . aspirin 81 MG chewable tablet Chew 81 mg by mouth at bedtime.     . carvedilol (COREG) 6.25 MG tablet Take by mouth.    . cefadroxil (DURICEF) 500 MG capsule Take 1 capsule (500 mg total) by mouth  2 (two) times daily. 20 capsule 0  . furosemide (LASIX) 20 MG tablet Take 20 mg by mouth daily as needed for fluid.     Marland Kitchen letrozole (FEMARA) 2.5 MG tablet Take 1 tablet (2.5 mg total) by mouth daily. 90 tablet 3  . naproxen sodium (ANAPROX) 220 MG tablet Take 220 mg by mouth 2 (two) times daily with a meal.    . pantoprazole (PROTONIX) 40 MG tablet Take 40 mg by mouth every morning.     Marland Kitchen atorvastatin (LIPITOR) 80 MG tablet Take 80 mg by mouth daily at 6 PM.     . niacin (NIASPAN) 500 MG CR tablet Take 1 mg by mouth at bedtime.      No current facility-administered medications for this visit.     Review of Systems Review of Systems  Constitutional: Negative.   Respiratory: Negative.   Cardiovascular: Negative.     Blood pressure (!) 144/82, pulse 77, resp. rate 14, height 5\' 6"  (1.676 m), weight 179 lb (81.2 kg).  Physical Exam Physical Exam  Constitutional: She is oriented to person, place, and time. She appears well-developed and well-nourished.  Pulmonary/Chest:    Neurological: She is alert and oriented to person, place, and time.  Skin: Skin is warm and dry.    Data Reviewed 01/25/2017 culture: Component  4d ago  Anaerobic Culture Final report   Result 1 Comment   Comment: No anaerobic growth in 72 hours.  Aerobic Culture Final report   Result 1 Mixed skin flora      Assessment    Moderate improvement in the right breast pain and redness after aspiration and institution of anti-inflammatory therapy.   Plan    The patient's making some progress, and we'll plan for a follow-up examination in one week. If she's not significantly resolved or if she shows any worsening will proceed to operative drainage.  Considering the culture results will hold antibiotic therapy at this time.    Patient to return in one week. The patient is aware to call back for any questions or concerns.    HPI, Physical Exam, Assessment and Plan have been scribed under the direction and in  the presence of Robert Bellow, MD  Concepcion Living, LPN  I have completed the exam and reviewed the above documentation for accuracy and completeness.  I agree with the above.  Haematologist has been used and any errors in dictation or transcription are unintentional.  Hervey Ard, M.D., F.A.C.S.  Robert Bellow 01/29/2017, 10:14 PM

## 2017-02-01 ENCOUNTER — Telehealth: Payer: Self-pay | Admitting: *Deleted

## 2017-02-01 NOTE — Telephone Encounter (Signed)
Patient called stating that she is taking Cefadroxil for her right breast infection. Her last dose of the antibiotic is Saturday and she follows up with you on 02/06/17. Her right breast still hurts and still hard with a constant ache. She was wondering after her last dose does she need to get more or wait until she comes in on Tuesday.

## 2017-02-01 NOTE — Telephone Encounter (Signed)
Have the patient come in Friday at 8:30 AM for recheck.

## 2017-02-02 ENCOUNTER — Encounter: Payer: Self-pay | Admitting: General Surgery

## 2017-02-02 ENCOUNTER — Ambulatory Visit (INDEPENDENT_AMBULATORY_CARE_PROVIDER_SITE_OTHER): Payer: Medicare HMO | Admitting: General Surgery

## 2017-02-02 VITALS — BP 138/70 | HR 68 | Temp 98.7°F | Resp 12 | Ht 66.0 in | Wt 180.0 lb

## 2017-02-02 DIAGNOSIS — N6489 Other specified disorders of breast: Secondary | ICD-10-CM

## 2017-02-02 MED ORDER — CLINDAMYCIN HCL 300 MG PO CAPS: 300.0000 mg | ORAL_CAPSULE | Freq: Three times a day (TID) | ORAL | 0 refills | Status: DC

## 2017-02-02 NOTE — Patient Instructions (Signed)
Patient to return as scheduled. The patient is aware to call back for any questions or concerns.  

## 2017-02-02 NOTE — Progress Notes (Signed)
Patient ID: Courtney Herrera, female   DOB: 1940-04-24, 76 y.o.   MRN: [id number]  Chief Complaint  Patient presents with  . Follow-up    HPI Courtney Herrera is a 76 y.o. female here today for her follow up breast seroma. Patient staets the area is still hard. She will finish her antibiotics tomorrow.  HPI  Past Medical History:  Diagnosis Date  . Breast cancer ([place]) 05/11/2016   T1c,N0; ER>90%, PR neg, Her 2 neu negative by FISH. Mammoprint: High risk.   . Cancer [facility]) 2012   uterine- radiation  . GERD (gastroesophageal reflux disease)   . Hyperlipidemia     Past Surgical History:  Procedure Laterality Date  . ABDOMINAL HYSTERECTOMY  2012  . BREAST BIOPSY Left    neg  . BREAST BIOPSY Right 05/11/2016   INVASIVE MAMMARY CARCINOMA  . BREAST BIOPSY Bilateral 05/26/2016   bilat stereo path pending  . BREAST CYST ASPIRATION Left    neg  . BREAST EXCISIONAL BIOPSY Right 06/01/2016   + lumpectomy  . BREAST LUMPECTOMY WITH SENTINEL LYMPH NODE BIOPSY Right 06/01/2016   Procedure: BREAST LUMPECTOMY WITH SENTINEL LYMPH NODE BX;  Surgeon: Robert Bellow, MD;  Location: [facility] ORS;  Service: General;  Laterality: Right;    Family History  Problem Relation Age of Onset  . Breast cancer Sister 30  . Cancer Brother        brain  . Cancer Sister   . Non-Hodgkin's lymphoma Brother     Social History Social History  Substance Use Topics  . Smoking status: Never Smoker  . Smokeless tobacco: Never Used  . Alcohol use No    Allergies  Allergen Reactions  . Sulphadimidine [Sulfamethazine] Swelling  . Petrolatum-Zinc Oxide Swelling    Current Outpatient Prescriptions  Medication Sig Dispense Refill  . aspirin 81 MG chewable tablet Chew 81 mg by mouth at bedtime.     . carvedilol (COREG) 6.25 MG tablet Take by mouth.    . cefadroxil (DURICEF) 500 MG capsule Take 1 capsule (500 mg total) by mouth 2 (two) times daily. 20 capsule 0  . furosemide (LASIX) 20 MG tablet Take 20 mg by  mouth daily as needed for fluid.     Marland Kitchen letrozole (FEMARA) 2.5 MG tablet Take 1 tablet (2.5 mg total) by mouth daily. 90 tablet 3  . naproxen sodium (ANAPROX) 220 MG tablet Take 220 mg by mouth 2 (two) times daily with a meal.    . pantoprazole (PROTONIX) 40 MG tablet Take 40 mg by mouth every morning.     Marland Kitchen atorvastatin (LIPITOR) 80 MG tablet Take 80 mg by mouth daily at 6 PM.     . clindamycin (CLEOCIN) 300 MG capsule Take 1 capsule (300 mg total) by mouth 3 (three) times daily. 30 capsule 0  . niacin (NIASPAN) 500 MG CR tablet Take 1 mg by mouth at bedtime.      No current facility-administered medications for this visit.     Review of Systems Review of Systems  Constitutional: Negative.   Respiratory: Negative.   Cardiovascular: Negative.     Blood pressure 138/70, pulse 68, temperature 98.7 F (37.1 C), temperature source Oral, resp. rate 12, height 5\' 6"  (1.676 m), weight 180 lb (81.6 kg).  Physical Exam Physical Exam  Constitutional: She is oriented to person, place, and time. She appears well-developed and well-nourished.  Pulmonary/Chest:    Neurological: She is alert and oriented to person, place, and time.  Skin: Skin  is warm and dry.  Drained 1oz of fluid.   Data Reviewed Ultrasound examination was repeated showing a 3-4 cm fluid pocket below the wide excision site at the 9:00 position of the breast. The patient was amenable to repeat aspiration. ChloraPrep was applied to the skin followed by 1 mL of 1% Xylocaine plain and 30 mL of somewhat turbid odorless fluid was obtained. The cavity collapsed completely with mild volume loss below the wide excision site. No images, no charge.  Previous culture had shown mixed skin flora.   Assessment    Persistent erythema and warmth, turbid fluid suggesting possible infection. Inconclusive culture earlier in treatment course.    Plan    We'll discontinue Duricef and initiate clindamycin. The patient has been asked to make  use of yogurt daily to minimize GI side effects. Continue use of local heat for comfort.    Patient to return as scheduled. Patient to take Clindamcyin three times daily for ten days.  The patient is aware to call back for any questions or concerns. .   HPI, Physical Exam, Assessment and Plan have been scribed under the direction and in the presence of Hervey Ard, MD.  Gaspar Cola, CMA  I have completed the exam and reviewed the above documentation for accuracy and completeness.  I agree with the above.  Haematologist has been used and any errors in dictation or transcription are unintentional.  Hervey Ard, M.D., F.A.C.S.   Robert Bellow 02/04/2017, 9:52 AM

## 2017-02-06 ENCOUNTER — Inpatient Hospital Stay: Payer: Self-pay

## 2017-02-06 ENCOUNTER — Ambulatory Visit (INDEPENDENT_AMBULATORY_CARE_PROVIDER_SITE_OTHER): Payer: Medicare HMO | Admitting: General Surgery

## 2017-02-06 ENCOUNTER — Other Ambulatory Visit: Payer: Self-pay

## 2017-02-06 VITALS — BP 122/76 | HR 96 | Resp 12 | Ht 66.0 in | Wt 180.0 lb

## 2017-02-06 DIAGNOSIS — C50511 Malignant neoplasm of lower-outer quadrant of right female breast: Secondary | ICD-10-CM

## 2017-02-06 DIAGNOSIS — N6489 Other specified disorders of breast: Secondary | ICD-10-CM

## 2017-02-06 DIAGNOSIS — Z170 Estrogen receptor positive status [ER+]: Principal | ICD-10-CM

## 2017-02-06 NOTE — Progress Notes (Signed)
Patient ID: Courtney Herrera, female   DOB: May 24, 1940, 76 y.o.   MRN: [id number]  Chief Complaint  Patient presents with  . Follow-up    HPI Courtney Herrera is a 76 y.o. female here today for her follow up right  breast seroma. Patient states the area is still hard, but the area feels much better. No redness.  HPI  Past Medical History:  Diagnosis Date  . Breast cancer ([place]) 05/11/2016   T1c,N0; ER>90%, PR neg, Her 2 neu negative by FISH. Mammoprint: High risk.   . Cancer [facility]) 2012   uterine- radiation  . GERD (gastroesophageal reflux disease)   . Hyperlipidemia     Past Surgical History:  Procedure Laterality Date  . ABDOMINAL HYSTERECTOMY  2012  . BREAST BIOPSY Left    neg  . BREAST BIOPSY Right 05/11/2016   INVASIVE MAMMARY CARCINOMA  . BREAST BIOPSY Bilateral 05/26/2016   bilat stereo path pending  . BREAST CYST ASPIRATION Left    neg  . BREAST EXCISIONAL BIOPSY Right 06/01/2016   + lumpectomy  . BREAST LUMPECTOMY WITH SENTINEL LYMPH NODE BIOPSY Right 06/01/2016   Procedure: BREAST LUMPECTOMY WITH SENTINEL LYMPH NODE BX;  Surgeon: Robert Bellow, MD;  Location: [facility] ORS;  Service: General;  Laterality: Right;    Family History  Problem Relation Age of Onset  . Breast cancer Sister 70  . Cancer Brother        brain  . Cancer Sister   . Non-Hodgkin's lymphoma Brother     Social History Social History  Substance Use Topics  . Smoking status: Never Smoker  . Smokeless tobacco: Never Used  . Alcohol use No    Allergies  Allergen Reactions  . Sulphadimidine [Sulfamethazine] Swelling  . Petrolatum-Zinc Oxide Swelling    Current Outpatient Prescriptions  Medication Sig Dispense Refill  . aspirin 81 MG chewable tablet Chew 81 mg by mouth at bedtime.     . carvedilol (COREG) 6.25 MG tablet Take by mouth.    . cefadroxil (DURICEF) 500 MG capsule Take 1 capsule (500 mg total) by mouth 2 (two) times daily. 20 capsule 0  . clindamycin (CLEOCIN) 300 MG capsule  Take 1 capsule (300 mg total) by mouth 3 (three) times daily. 30 capsule 0  . furosemide (LASIX) 20 MG tablet Take 20 mg by mouth daily as needed for fluid.     Marland Kitchen letrozole (FEMARA) 2.5 MG tablet Take 1 tablet (2.5 mg total) by mouth daily. 90 tablet 3  . naproxen sodium (ANAPROX) 220 MG tablet Take 220 mg by mouth 2 (two) times daily with a meal.    . pantoprazole (PROTONIX) 40 MG tablet Take 40 mg by mouth every morning.     Marland Kitchen atorvastatin (LIPITOR) 80 MG tablet Take 80 mg by mouth daily at 6 PM.     . niacin (NIASPAN) 500 MG CR tablet Take 1 mg by mouth at bedtime.      No current facility-administered medications for this visit.     Review of Systems Review of Systems  Constitutional: Negative.   Respiratory: Negative.   Cardiovascular: Negative.     Blood pressure 122/76, pulse 96, resp. rate 12, height 5\' 6"  (1.676 m), weight 180 lb (81.6 kg).  Physical Exam Physical Exam  Pulmonary/Chest:    Clinical evidence of seroma reoccurrence with loss of concavity at wide excision site noted after aspiration 4 days ago.    Data Reviewed Ultrasound confirmed recurrence of the seroma cavity.  2  mL of 1% plain Xylocaine was utilized and a 16-gauge seroma cath placed under ultrasound guidance. Less turbid fluid that noted 4 days ago was returned and the catheter was placed to closed suction. Anchored into position with a 3-0 nylon suture. Telfa and Tegaderm dressing applied. Wound care instructions reviewed.  Assessment    Recurrent seroma, marked improvement in breast erythema and induration.    Plan    My hope as we can avoid surgical drainage and debridement of the seroma cavity, reassess in 2 days.     HPI, Physical Exam, Assessment and Plan have been scribed under the direction and in the presence of Hervey Ard, MD.  Gaspar Cola, CMA  I have completed the exam and reviewed the above documentation for accuracy and completeness.  I agree with the above.  Development worker, community has been used and any errors in dictation or transcription are unintentional.  Hervey Ard, M.D., F.A.C.S.  Robert Bellow 02/06/2017, 8:17 PM

## 2017-02-06 NOTE — Patient Instructions (Signed)
Return in 2 days

## 2017-02-08 ENCOUNTER — Encounter: Payer: Self-pay | Admitting: General Surgery

## 2017-02-08 ENCOUNTER — Ambulatory Visit (INDEPENDENT_AMBULATORY_CARE_PROVIDER_SITE_OTHER): Payer: Medicare HMO | Admitting: General Surgery

## 2017-02-08 VITALS — BP 148/84 | HR 82 | Resp 16 | Ht 66.0 in | Wt 180.0 lb

## 2017-02-08 DIAGNOSIS — N6489 Other specified disorders of breast: Secondary | ICD-10-CM

## 2017-02-08 NOTE — Patient Instructions (Addendum)
The patient is aware to use a heating pad as needed for comfort. The patient is aware to call back for any questions or concerns. Follow up next week Continue antibiotics

## 2017-02-08 NOTE — Progress Notes (Signed)
Patient ID: Courtney Herrera, female   DOB: 1940-04-19, 76 y.o.   MRN: [id number]  Chief Complaint  Patient presents with  . Follow-up    HPI Courtney Herrera is a 76 y.o. female.  Here for follow up seroma right breast. Drain sheet present. She states she feels "ok".  HPI  Past Medical History:  Diagnosis Date  . Breast cancer ([place]) 05/11/2016   T1c,N0; ER>90%, PR neg, Her 2 neu negative by FISH. Mammoprint: High risk.   . 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 GERD (gastroesophageal reflux disease)   . Hyperlipidemia     Past Surgical History:  Procedure Laterality Date  . ABDOMINAL HYSTERECTOMY  2012  . BREAST BIOPSY Left    neg  . BREAST BIOPSY Right 05/11/2016   INVASIVE MAMMARY CARCINOMA  . BREAST BIOPSY Bilateral 05/26/2016   bilat stereo path pending  . BREAST CYST ASPIRATION Left    neg  . BREAST EXCISIONAL BIOPSY Right 06/01/2016   + lumpectomy  . BREAST LUMPECTOMY WITH SENTINEL LYMPH NODE BIOPSY Right 06/01/2016   Procedure: BREAST LUMPECTOMY WITH SENTINEL LYMPH NODE BX;  Surgeon: Robert Bellow, MD;  Location: [facility] ORS;  Service: General;  Laterality: Right;    Family History  Problem Relation Age of Onset  . Breast cancer Sister 77  . Cancer Brother        brain  . Cancer Sister   . Non-Hodgkin's lymphoma Brother     Social History Social History  Substance Use Topics  . Smoking status: Never Smoker  . Smokeless tobacco: Never Used  . Alcohol use No    Allergies  Allergen Reactions  . Sulphadimidine [Sulfamethazine] Swelling  . Petrolatum-Zinc Oxide Swelling    Current Outpatient Prescriptions  Medication Sig Dispense Refill  . aspirin 81 MG chewable tablet Chew 81 mg by mouth at bedtime.     . carvedilol (COREG) 6.25 MG tablet Take by mouth.    . clindamycin (CLEOCIN) 300 MG capsule Take 1 capsule (300 mg total) by mouth 3 (three) times daily. 30 capsule 0  . furosemide (LASIX) 20 MG tablet Take 20 mg by mouth daily as needed for fluid.      Marland Kitchen letrozole (FEMARA) 2.5 MG tablet Take 1 tablet (2.5 mg total) by mouth daily. 90 tablet 3  . naproxen sodium (ANAPROX) 220 MG tablet Take 220 mg by mouth 2 (two) times daily with a meal.    . pantoprazole (PROTONIX) 40 MG tablet Take 40 mg by mouth every morning.     Marland Kitchen atorvastatin (LIPITOR) 80 MG tablet Take 80 mg by mouth daily at 6 PM.     . niacin (NIASPAN) 500 MG CR tablet Take 1 mg by mouth at bedtime.      No current facility-administered medications for this visit.     Review of Systems Review of Systems  Constitutional: Negative.   Respiratory: Negative.   Cardiovascular: Negative.     Blood pressure (!) 148/84, pulse 82, resp. rate 16, height 5\' 6"  (1.676 m), weight 180 lb (81.6 kg), SpO2 100 %.  Physical Exam Physical Exam  Constitutional: She is oriented to person, place, and time. She appears well-developed and well-nourished.  HENT:  Mouth/Throat: Oropharynx is clear and moist.  Eyes: Conjunctivae are normal. No scleral icterus.  Neck: Neck supple.  Cardiovascular: Normal rate, regular rhythm and normal heart sounds.   Pulmonary/Chest: Effort normal and breath sounds normal.    Lymphadenopathy:    She has no  cervical adenopathy.  Neurological: She is alert and oriented to person, place, and time.  Skin: Skin is warm and dry.  Psychiatric: Her behavior is normal.    Data Reviewed Only 10 mL of fluid drained last 2 days. Seroma catheter bulb not holding vacuum.  Brief ultrasound, no images, no charge showed no discernible fluid pocket.  Surgical specimen showed 2 biopsy sites. Only the top hat clip at the site of  ADH  was identified although 2 biopsy sites were identified. On review of the mammograms prior to surgery, the 2 areas of interest were approximated 3 cm apart. On the images of the wide excision specimen, a nodular density (without marker) is approximately 3 cm from the marker from the second procedure prep sees microscopic foci  ADH).   Assessment    Slowly resolving seroma.    Plan    Patient will complete her present course of clindamycin. Continue local heat.    Follow up next week Continue antibiotics  The patient is aware to use a heating pad as needed for comfort.   HPI, Physical Exam, Assessment and Plan have been scribed under the direction and in the presence of Robert Bellow, MD. Karie Fetch, RN  I have completed the exam and reviewed the above documentation for accuracy and completeness.  I agree with the above.  Haematologist has been used and any errors in dictation or transcription are unintentional.  Hervey Ard, M.D., F.A.C.S.  Robert Bellow 02/08/2017, 8:43 PM

## 2017-02-12 ENCOUNTER — Ambulatory Visit (INDEPENDENT_AMBULATORY_CARE_PROVIDER_SITE_OTHER): Payer: Medicare HMO | Admitting: General Surgery

## 2017-02-12 ENCOUNTER — Encounter: Payer: Self-pay | Admitting: General Surgery

## 2017-02-12 VITALS — BP 130/70 | HR 72 | Resp 14 | Ht 66.0 in | Wt 182.0 lb

## 2017-02-12 DIAGNOSIS — N6489 Other specified disorders of breast: Secondary | ICD-10-CM

## 2017-02-12 NOTE — Patient Instructions (Addendum)
Follow up in one week. If anything changes call us.

## 2017-02-12 NOTE — Progress Notes (Signed)
Patient ID: Courtney Herrera, female   DOB: [birth year], 76 y.o.   MRN: [id number]  Chief Complaint  Patient presents with  . Follow-up    HPI Courtney Herrera is a 76 y.o. female here for evalation of right breast seroma. She states that the area feels better but a little tender.  HPI  Past Medical History:  Diagnosis Date  . Breast cancer ([place]) 05/11/2016   T1c,N0; ER>90%, PR neg, Her 2 neu negative by FISH. Mammoprint: High risk.   . 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 GERD (gastroesophageal reflux disease)   . Hyperlipidemia     Past Surgical History:  Procedure Laterality Date  . ABDOMINAL HYSTERECTOMY  2012  . BREAST BIOPSY Left    neg  . BREAST BIOPSY Right 05/11/2016   INVASIVE MAMMARY CARCINOMA  . BREAST BIOPSY Bilateral 05/26/2016   bilat stereo path pending  . BREAST CYST ASPIRATION Left    neg  . BREAST EXCISIONAL BIOPSY Right 06/01/2016   + lumpectomy  . BREAST LUMPECTOMY WITH SENTINEL LYMPH NODE BIOPSY Right 06/01/2016   Procedure: BREAST LUMPECTOMY WITH SENTINEL LYMPH NODE BX;  Surgeon: Robert Bellow, MD;  Location: [facility] ORS;  Service: General;  Laterality: Right;    Family History  Problem Relation Age of Onset  . Breast cancer Sister 14  . Cancer Brother        brain  . Cancer Sister   . Non-Hodgkin's lymphoma Brother     Social History Social History  Substance Use Topics  . Smoking status: Never Smoker  . Smokeless tobacco: Never Used  . Alcohol use No    Allergies  Allergen Reactions  . Sulphadimidine [Sulfamethazine] Swelling  . Petrolatum-Zinc Oxide Swelling    Current Outpatient Prescriptions  Medication Sig Dispense Refill  . aspirin 81 MG chewable tablet Chew 81 mg by mouth at bedtime.     . carvedilol (COREG) 6.25 MG tablet Take by mouth.    . clindamycin (CLEOCIN) 300 MG capsule Take 1 capsule (300 mg total) by mouth 3 (three) times daily. 30 capsule 0  . furosemide (LASIX) 20 MG tablet Take 20 mg by mouth daily as needed  for fluid.     Marland Kitchen letrozole (FEMARA) 2.5 MG tablet Take 1 tablet (2.5 mg total) by mouth daily. 90 tablet 3  . naproxen sodium (ANAPROX) 220 MG tablet Take 220 mg by mouth 2 (two) times daily with a meal.    . pantoprazole (PROTONIX) 40 MG tablet Take 40 mg by mouth every morning.     Marland Kitchen atorvastatin (LIPITOR) 80 MG tablet Take 80 mg by mouth daily at 6 PM.     . niacin (NIASPAN) 500 MG CR tablet Take 1 mg by mouth at bedtime.      No current facility-administered medications for this visit.     Review of Systems Review of Systems  Constitutional: Negative.   Respiratory: Negative.   Cardiovascular: Negative.     Blood pressure 130/70, pulse 72, resp. rate 14, height 5\' 6"  (1.676 m), weight 182 lb (82.6 kg).  Physical Exam Physical Exam  Constitutional: She appears well-developed and well-nourished.  Eyes: Conjunctivae are normal. No scleral icterus.  Neck: Neck supple.  Pulmonary/Chest:    Less redness noted right breast   Lymphadenopathy:    She has no cervical adenopathy.    She has no axillary adenopathy.  Skin: Skin is warm and dry.  Psychiatric: She has a normal mood and affect.  Assessment    Near complete resolution of previously noted erythema.    Plan    No indication for repeat aspiration at this time.  Patient encouraged to call promptly if she notices increased redness or pain peer  Follow up in one week    HPI, Physical Exam, Assessment and Plan have been scribed under the direction and in the presence of Robert Bellow, MD  Concepcion Living, LPN   Robert Bellow 02/12/2017, 9:24 AM

## 2017-02-13 NOTE — Progress Notes (Signed)
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 High risk Oncotype with recurrence score of 44.  INTERVAL HISTORY: Patient returns to clinic today for routine 13-monthevaluation.  She is tolerating letrozole well without significant side effects.  She is actively being treated for appears to be butted seroma, but otherwise feels well. She continues close follow-up with surgery. She has no neurologic complaints. She denies any recent fevers or illnesses. She has a good appetite and denies weight loss. She has no chest pain or shortness of breath. She denies any nausea, vomiting, constipation, or diarrhea. She has no urinary complaints. Patient offers no further specific complaints today.  REVIEW OF SYSTEMS:   Review of Systems  Constitutional: Negative.  Negative for fever, malaise/fatigue and weight loss.  Respiratory: Negative.  Negative for cough and shortness of breath.   Cardiovascular: Negative.  Negative for chest pain and leg swelling.  Gastrointestinal: Negative.  Negative for abdominal pain.  Genitourinary: Negative.   Musculoskeletal: Negative.   Skin: Negative.  Negative for rash.  Neurological: Negative.  Negative for weakness.  Psychiatric/Behavioral: Negative.  The patient is not nervous/anxious.     As per HPI. Otherwise, a complete review of systems is negative.  PAST MEDICAL HISTORY: Past Medical History:  Diagnosis Date  . Breast cancer ([place] 05/11/2016   T1c,N0; ER>90%, PR neg, Her 2 neu negative by FISH. Mammoprint: High risk.   . Cancer ([facility] 2012   uterine- radiation  . GERD (gastroesophageal reflux disease)   . Hyperlipidemia     PAST SURGICAL HISTORY: Past Surgical History:  Procedure Laterality Date  . ABDOMINAL HYSTERECTOMY  2012  . BREAST BIOPSY Left    neg  . BREAST BIOPSY Right 05/11/2016   INVASIVE MAMMARY CARCINOMA  . BREAST BIOPSY Bilateral 05/26/2016   bilat stereo path pending  . BREAST CYST ASPIRATION Left    neg  . BREAST EXCISIONAL BIOPSY Right 06/01/2016   + lumpectomy  . BREAST LUMPECTOMY WITH SENTINEL LYMPH NODE BIOPSY Right 06/01/2016   Procedure: BREAST LUMPECTOMY WITH SENTINEL LYMPH NODE BX;  Surgeon: JRobert Bellow MD;  Location: [facility] ORS;  Service: General;  Laterality: Right;    FAMILY HISTORY: Family History  Problem Relation Age of Onset  . Breast cancer Sister 64 . Cancer Brother        brain  . Cancer Sister   . Non-Hodgkin's lymphoma Brother     ADVANCED DIRECTIVES (Y/N):  N  HEALTH MAINTENANCE: Social History  Substance Use Topics  . Smoking status: Never Smoker  . Smokeless tobacco: Never Used  . Alcohol use No     Colonoscopy:  PAP:  Bone density:  Lipid panel:  Allergies  Allergen Reactions  . Sulphadimidine [Sulfamethazine] Swelling  . Petrolatum-Zinc Oxide Swelling    Current Outpatient Prescriptions  Medication Sig Dispense Refill  . aspirin 81 MG chewable tablet Chew 81 mg by mouth at bedtime.     .Marland Kitchenatorvastatin (LIPITOR) 80 MG tablet TAKE 1 TABLET (80 MG TOTAL) BY MOUTH ONCE DAILY.  1  . carvedilol (COREG) 6.25 MG tablet Take by mouth.    . furosemide (LASIX) 20 MG tablet  Take 20 mg by mouth daily as needed for fluid.     Marland Kitchen letrozole (FEMARA) 2.5 MG tablet Take 1 tablet (2.5 mg total) by mouth daily. 90 tablet 3  . niacin (NIASPAN) 500 MG CR tablet Take 1 mg by mouth at bedtime.     . pantoprazole (PROTONIX) 40 MG tablet Take 40 mg by mouth every morning.      No current facility-administered medications for this visit.     OBJECTIVE: Vitals:    02/15/17 1412  BP: (!) 151/88  Pulse: 73  Resp: 18  Temp: 97.8 F (36.6 C)     Body mass index is 29.41 kg/m.    ECOG FS:0 - Asymptomatic  General: Well-developed, well-nourished, no acute distress. Eyes: Pink conjunctiva, anicteric sclera. Breasts: Exam deferred today. Lungs: Clear to auscultation bilaterally. Heart: Regular rate and rhythm. No rubs, murmurs, or gallops. Abdomen: Soft, nontender, nondistended. No organomegaly noted, normoactive bowel sounds. Musculoskeletal: No edema, cyanosis, or clubbing. Neuro: Alert, answering all questions appropriately. Cranial nerves grossly intact. Skin: No rashes or petechiae noted. Psych: Normal affect.   LAB RESULTS:  Lab Results  Component Value Date   NA 139 10/17/2016   K 4.4 10/17/2016   CL 105 10/17/2016   CO2 27 10/17/2016   GLUCOSE 110 (H) 10/17/2016   BUN 28 (H) 10/17/2016   CREATININE 0.79 10/17/2016   CALCIUM 8.9 10/17/2016   PROT 6.3 (L) 10/17/2016   ALBUMIN 3.7 10/17/2016   AST 14 (L) 10/17/2016   ALT 13 (L) 10/17/2016   ALKPHOS 64 10/17/2016   BILITOT 0.5 10/17/2016   GFRNONAA >60 10/17/2016   GFRAA >60 10/17/2016    Lab Results  Component Value Date   WBC 3.4 (L) 11/09/2016   NEUTROABS 3.1 09/05/2016   HGB 12.1 11/09/2016   HCT 35.7 11/09/2016   MCV 89.3 11/09/2016   PLT 173 11/09/2016     STUDIES: US Breast Complete Uni Right Inc Axilla  Result Date: 02/06/2017 Ultrasound confirmed recurrence of the seroma cavity. No images.  2 mL of 1% plain Xylocaine was utilized and a 16-gauge seroma cath placed under ultrasound guidance. Less turbid fluid that noted 4 days ago was returned and the catheter was placed to closed suction. Anchored into position with a 3-0 nylon suture. Telfa and Tegaderm dressing applied. Wound care instructions reviewed.  US Breast Complete Uni Right Inc Axilla  Result Date: 01/25/2017 Ultrasound examination of the right breast was undertaken due to the marked change since  her last visit. Evidence of a significant seroma measuring 2.25 x 2.42 x 4.5 cm was noted. The patient was amenable to aspiration. ChloraPrep was applied to the skin followed by 1 mL of 1% plain Xylocaine. Approximately 30 mL of slightly turbid, pale straw-colored fluid was noted. No odor.    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 High risk Oncotype with recurrence score of 44.   PLAN:    1. 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 Her 10 year risk of distant recurrence with tamoxifen alone was approximately 30%, therefore adjuvant chemotherapy using Taxotere and Cytoxan was recommended. Patient completed treatment on Sep 05, 2016.  She has also completed adjuvant XRT.  Continue letrozole which she will take for 5 years completing treatment  in August 2023.  Return to clinic in 3 months for further evaluation. 2.  Infected seroma: Continue management and treatment per surgery. 3.  Osteopenia: Patient's most recent bone mineral density on November 30, 2016 reported a T score of -2.2.  She has been instructed to continue calcium vitamin D supplementation.  Repeat in August 2019.   Approximately 30 minutes was spent in discussion of which greater than 50% was consultation.  Patient expressed understanding and was in agreement with this plan. She also understands that She can call clinic at any time with any questions, concerns, or complaints.   Cancer Staging Primary cancer of upper outer quadrant of right female breast ([place]) HER 2 NEGATIVE  (FISH) Staging form: Breast, AJCC 8th Edition - Pathologic stage from 06/12/2016: Stage IA (pT1c, pN0, cM0, G2, ER: Positive, PR: Negative, HER2: Negative) - Signed by Lloyd Huger,  MD on 06/12/2016   Lloyd Huger, MD   02/16/2017 9:10 AM

## 2017-02-15 ENCOUNTER — Inpatient Hospital Stay: Payer: Medicare HMO | Attending: Oncology | Admitting: Oncology

## 2017-02-15 VITALS — BP 151/88 | HR 73 | Temp 97.8°F | Resp 18 | Wt 182.2 lb

## 2017-02-15 DIAGNOSIS — Z803 Family history of malignant neoplasm of breast: Secondary | ICD-10-CM

## 2017-02-15 DIAGNOSIS — E785 Hyperlipidemia, unspecified: Secondary | ICD-10-CM | POA: Insufficient documentation

## 2017-02-15 DIAGNOSIS — Z170 Estrogen receptor positive status [ER+]: Secondary | ICD-10-CM | POA: Diagnosis not present

## 2017-02-15 DIAGNOSIS — K219 Gastro-esophageal reflux disease without esophagitis: Secondary | ICD-10-CM | POA: Diagnosis not present

## 2017-02-15 DIAGNOSIS — Z7982 Long term (current) use of aspirin: Secondary | ICD-10-CM | POA: Diagnosis not present

## 2017-02-15 DIAGNOSIS — C50411 Malignant neoplasm of upper-outer quadrant of right female breast: Secondary | ICD-10-CM

## 2017-02-15 DIAGNOSIS — B998 Other infectious disease: Secondary | ICD-10-CM | POA: Insufficient documentation

## 2017-02-15 DIAGNOSIS — Z79811 Long term (current) use of aromatase inhibitors: Secondary | ICD-10-CM | POA: Insufficient documentation

## 2017-02-15 DIAGNOSIS — M8580 Other specified disorders of bone density and structure, unspecified site: Secondary | ICD-10-CM | POA: Insufficient documentation

## 2017-02-15 DIAGNOSIS — Z9221 Personal history of antineoplastic chemotherapy: Secondary | ICD-10-CM | POA: Diagnosis not present

## 2017-02-15 DIAGNOSIS — Z79899 Other long term (current) drug therapy: Secondary | ICD-10-CM | POA: Insufficient documentation

## 2017-02-15 DIAGNOSIS — Z807 Family history of other malignant neoplasms of lymphoid, hematopoietic and related tissues: Secondary | ICD-10-CM | POA: Diagnosis not present

## 2017-02-15 DIAGNOSIS — N6489 Other specified disorders of breast: Secondary | ICD-10-CM | POA: Insufficient documentation

## 2017-02-20 ENCOUNTER — Encounter: Payer: Self-pay | Admitting: General Surgery

## 2017-02-20 ENCOUNTER — Ambulatory Visit (INDEPENDENT_AMBULATORY_CARE_PROVIDER_SITE_OTHER): Payer: Medicare HMO | Admitting: General Surgery

## 2017-02-20 VITALS — BP 148/82 | HR 74 | Resp 12 | Ht 67.0 in | Wt 180.0 lb

## 2017-02-20 DIAGNOSIS — N6489 Other specified disorders of breast: Secondary | ICD-10-CM

## 2017-02-20 NOTE — Progress Notes (Signed)
Patient ID: Courtney Herrera, female   DOB: Jun 18, 1940, 76 y.o.   MRN: [id number]  Chief Complaint  Patient presents with  . Other    HPI Courtney Herrera is a 76 y.o. female here today for her follow up seroma. Patient states she saw Dr. Grayland Ormond on 02/15/2017.   HPI  Past Medical History:  Diagnosis Date  . Breast cancer ([place]) 05/11/2016   T1c,N0; ER>90%, PR neg, Her 2 neu negative by FISH. Mammoprint: High risk.   . 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 GERD (gastroesophageal reflux disease)   . Hyperlipidemia     Past Surgical History:  Procedure Laterality Date  . ABDOMINAL HYSTERECTOMY  2012  . BREAST BIOPSY Left    neg  . BREAST BIOPSY Right 05/11/2016   INVASIVE MAMMARY CARCINOMA  . BREAST BIOPSY Bilateral 05/26/2016   bilat stereo path pending  . BREAST CYST ASPIRATION Left    neg  . BREAST EXCISIONAL BIOPSY Right 06/01/2016   + lumpectomy    Family History  Problem Relation Age of Onset  . Breast cancer Sister 2  . Cancer Brother        brain  . Cancer Sister   . Non-Hodgkin's lymphoma Brother     Social History Social History   Tobacco Use  . Smoking status: Never Smoker  . Smokeless tobacco: Never Used  Substance Use Topics  . Alcohol use: No    Alcohol/week: 0.0 oz  . Drug use: No    Allergies  Allergen Reactions  . Sulphadimidine [Sulfamethazine] Swelling  . Petrolatum-Zinc Oxide Swelling    Current Outpatient Medications  Medication Sig Dispense Refill  . aspirin 81 MG chewable tablet Chew 81 mg by mouth at bedtime.     Marland Kitchen atorvastatin (LIPITOR) 80 MG tablet TAKE 1 TABLET (80 MG TOTAL) BY MOUTH ONCE DAILY.  1  . carvedilol (COREG) 6.25 MG tablet Take by mouth.    . furosemide (LASIX) 20 MG tablet Take 20 mg by mouth daily as needed for fluid.     Marland Kitchen letrozole (FEMARA) 2.5 MG tablet Take 1 tablet (2.5 mg total) by mouth daily. 90 tablet 3  . pantoprazole (PROTONIX) 40 MG tablet Take 40 mg by mouth every morning.     . niacin (NIASPAN)  500 MG CR tablet Take 1 mg by mouth at bedtime.      No current facility-administered medications for this visit.     Review of Systems Review of Systems  Constitutional: Negative.   Respiratory: Negative.   Cardiovascular: Negative.     Blood pressure (!) 148/82, pulse 74, resp. rate 12, height 5\' 7"  (1.702 m), weight 180 lb (81.6 kg).  Physical Exam Physical Exam  Constitutional: She is oriented to person, place, and time. She appears well-developed and well-nourished.  Pulmonary/Chest:    Neurological: She is alert and oriented to person, place, and time.  Skin: Skin is warm and dry.       Assessment    Resolution of right breast seroma thought secondary to activity.    Plan         Patient to return in one month. The patient is aware to call back for any questions or concerns.  We may push back her January 2019 mammograms if she still exhibiting significant amount of firmness to minimize discomfort during the procedure.   HPI, Physical Exam, Assessment and Plan have been scribed under the direction and in the presence of Hervey Ard, MD.  Janett Billow  Qualls, CMA  I have completed the exam and reviewed the above documentation for accuracy and completeness.  I agree with the above.  Haematologist has been used and any errors in dictation or transcription are unintentional.  Hervey Ard, M.D., F.A.C.S.  Robert Bellow 02/20/2017, 10:09 AM

## 2017-02-20 NOTE — Patient Instructions (Addendum)
Patient to return in one month. The patient is aware to call back for any questions or concerns. 

## 2017-02-23 ENCOUNTER — Telehealth: Payer: Self-pay

## 2017-02-23 NOTE — Telephone Encounter (Signed)
Patient called and had some concerns about some drainage from her right breast. She woke up this morning with some pale yellow drainage on her night gown that came from her nipple. She reports no drainage from her surgical site. She has had no further drainage from the nipple and no redness or tenderness anywhere in the breast. She was just concerned about this and what she should do about it.  Notified Dr Bary Castilla of this and he advised the patient to just watch the area and report any further drainage. He was not overly concerned at this point.  Patient notified of such and she will call if she has any further problems.

## 2017-03-14 ENCOUNTER — Encounter: Payer: Self-pay | Admitting: General Surgery

## 2017-03-14 ENCOUNTER — Ambulatory Visit (INDEPENDENT_AMBULATORY_CARE_PROVIDER_SITE_OTHER): Payer: Medicare HMO | Admitting: General Surgery

## 2017-03-14 VITALS — BP 128/68 | HR 70 | Resp 14 | Ht 64.0 in | Wt 180.0 lb

## 2017-03-14 DIAGNOSIS — N6489 Other specified disorders of breast: Secondary | ICD-10-CM

## 2017-03-14 NOTE — Patient Instructions (Signed)
The patient is aware to use a heating pad as needed for comfort. Return in two months after mammogram.

## 2017-03-14 NOTE — Progress Notes (Signed)
Patient ID: Courtney Herrera, female   DOB: 1940/05/13, 76 y.o.   MRN: [id number]  Chief Complaint  Patient presents with  . Follow-up    HPI Courtney Herrera is a 76 y.o. female here today following up with some orange drainage from her right nipple over the last week. In the last two days the area has been much better.  The patient had developed a significant seroma requiring drainage after yard work earlier this fall.  The patient reports no fever, chills or redness.  HPI  Past Medical History:  Diagnosis Date  . Breast cancer ([place]) 05/11/2016   T1c,N0; ER>90%, PR neg, Her 2 neu negative by FISH. Mammoprint: High risk.   . Cancer [facility]) 2012   uterine- radiation  . GERD (gastroesophageal reflux disease)   . Hyperlipidemia     Past Surgical History:  Procedure Laterality Date  . ABDOMINAL HYSTERECTOMY  2012  . BREAST BIOPSY Left    neg  . BREAST BIOPSY Right 05/11/2016   INVASIVE MAMMARY CARCINOMA  . BREAST BIOPSY Bilateral 05/26/2016   bilat stereo path pending  . BREAST CYST ASPIRATION Left    neg  . BREAST EXCISIONAL BIOPSY Right 06/01/2016   + lumpectomy  . BREAST LUMPECTOMY WITH SENTINEL LYMPH NODE BIOPSY Right 06/01/2016   Procedure: BREAST LUMPECTOMY WITH SENTINEL LYMPH NODE BX;  Surgeon: Robert Bellow, MD;  Location: [facility] ORS;  Service: General;  Laterality: Right;    Family History  Problem Relation Age of Onset  . Breast cancer Sister 27  . Cancer Brother        brain  . Cancer Sister   . Non-Hodgkin's lymphoma Brother     Social History Social History   Tobacco Use  . Smoking status: Never Smoker  . Smokeless tobacco: Never Used  Substance Use Topics  . Alcohol use: No    Alcohol/week: 0.0 oz  . Drug use: No    Allergies  Allergen Reactions  . Sulphadimidine [Sulfamethazine] Swelling  . Petrolatum-Zinc Oxide Swelling    Current Outpatient Medications  Medication Sig Dispense Refill  . aspirin 81 MG chewable tablet Chew 81 mg by mouth at  bedtime.     Marland Kitchen atorvastatin (LIPITOR) 80 MG tablet TAKE 1 TABLET (80 MG TOTAL) BY MOUTH ONCE DAILY.  1  . carvedilol (COREG) 6.25 MG tablet Take by mouth.    . furosemide (LASIX) 20 MG tablet Take 20 mg by mouth daily as needed for fluid.     Marland Kitchen letrozole (FEMARA) 2.5 MG tablet Take 1 tablet (2.5 mg total) by mouth daily. 90 tablet 3  . niacin (NIASPAN) 500 MG CR tablet Take 1 mg by mouth at bedtime.     . pantoprazole (PROTONIX) 40 MG tablet Take 40 mg by mouth every morning.      No current facility-administered medications for this visit.     Review of Systems Review of Systems  Constitutional: Negative.   Respiratory: Negative.   Cardiovascular: Negative.     Blood pressure 128/68, pulse 70, resp. rate 14, height 5\' 4"  (1.626 m), weight 180 lb (81.6 kg).  Physical Exam Physical Exam  Constitutional: She is oriented to person, place, and time. She appears well-developed and well-nourished.  Cardiovascular: Normal rate, regular rhythm and normal heart sounds.  Pulmonary/Chest: Effort normal and breath sounds normal. Right breast exhibits no inverted nipple, no mass, no nipple discharge, no skin change and no tenderness. Left breast exhibits no inverted nipple, no mass, no nipple discharge, no  skin change and no tenderness.    Lymphadenopathy:    She has no cervical adenopathy.    She has no axillary adenopathy.  Neurological: She is alert and oriented to person, place, and time.  Skin: Skin is warm and dry.       Assessment    Resolution of seroma, likely final drainage through the nipple.    Plan         The patient is aware to use a heating pad for the next several weeks to help resolve the residual thickening in the breast.  She is concerned about discomfort with her upcoming mammogram.  She was encouraged to make use of Aleve, 2 tablets 1 hour prior to the exam.  She was encouraged to speak frankly with the radiology technologist about pressure if it is too  uncomfortable.  . Return in two months after mammogram.   HPI, Physical Exam, Assessment and Plan have been scribed under the direction and in the presence of Hervey Ard, MD.  Gaspar Cola, CMA  I have completed the exam and reviewed the above documentation for accuracy and completeness.  I agree with the above.  Haematologist has been used and any errors in dictation or transcription are unintentional.  Hervey Ard, M.D., F.A.C.S.  Robert Bellow 03/14/2017, 12:06 PM

## 2017-03-28 ENCOUNTER — Ambulatory Visit: Payer: Medicare HMO | Admitting: General Surgery

## 2017-04-18 ENCOUNTER — Encounter: Payer: Self-pay | Admitting: General Surgery

## 2017-04-26 ENCOUNTER — Other Ambulatory Visit: Payer: Medicare HMO

## 2017-04-26 ENCOUNTER — Ambulatory Visit
Admission: RE | Admit: 2017-04-26 | Discharge: 2017-04-26 | Disposition: A | Discharge status: Home / Self Care | Payer: Medicare HMO | Source: Ambulatory Visit | Attending: General Surgery | Admitting: General Surgery

## 2017-04-26 DIAGNOSIS — N6452 Nipple discharge: Secondary | ICD-10-CM | POA: Insufficient documentation

## 2017-04-26 DIAGNOSIS — Z170 Estrogen receptor positive status [ER+]: Secondary | ICD-10-CM | POA: Diagnosis not present

## 2017-04-26 DIAGNOSIS — Z923 Personal history of irradiation: Secondary | ICD-10-CM | POA: Diagnosis not present

## 2017-04-26 DIAGNOSIS — C50511 Malignant neoplasm of lower-outer quadrant of right female breast: Secondary | ICD-10-CM

## 2017-04-26 HISTORY — DX: Personal history of irradiation: Z92.3

## 2017-04-26 HISTORY — DX: Personal history of antineoplastic chemotherapy: Z92.21

## 2017-05-03 ENCOUNTER — Encounter: Payer: Self-pay | Admitting: General Surgery

## 2017-05-03 ENCOUNTER — Ambulatory Visit: Payer: Medicare HMO | Admitting: General Surgery

## 2017-05-03 ENCOUNTER — Inpatient Hospital Stay: Payer: Self-pay

## 2017-05-03 VITALS — BP 138/80 | HR 72 | Resp 14 | Ht 66.0 in | Wt 185.0 lb

## 2017-05-03 DIAGNOSIS — C50911 Malignant neoplasm of unspecified site of right female breast: Secondary | ICD-10-CM | POA: Diagnosis not present

## 2017-05-03 DIAGNOSIS — Z170 Estrogen receptor positive status [ER+]: Secondary | ICD-10-CM

## 2017-05-03 NOTE — Progress Notes (Signed)
Patient ID: Courtney Herrera, female   DOB: October 16, 1940, 76 y.o.   MRN: [id number]  Chief Complaint  Patient presents with  . Follow-up    HPI Courtney Herrera is a 77 y.o. female who presents for a breast evaluation. The most recent mammogram was done on 04/26/2017 . She states her right nipple still has been leaking off and on. Patient does perform regular self breast checks and gets regular mammograms done.    HPI  Past Medical History:  Diagnosis Date  . Breast cancer ([place]) 05/11/2016   Right breast  . Cancer ([place]) 2012   uterine- radiation  . GERD (gastroesophageal reflux disease)   . Hyperlipidemia   . Personal history of chemotherapy   . Personal history of radiation therapy     Past Surgical History:  Procedure Laterality Date  . ABDOMINAL HYSTERECTOMY  2012  . BREAST BIOPSY Left    neg  . BREAST BIOPSY Right 05/11/2016   INVASIVE MAMMARY CARCINOMA  . BREAST CYST ASPIRATION Left    neg  . BREAST EXCISIONAL BIOPSY Right 06/01/2016   + lumpectomy  . BREAST LUMPECTOMY WITH SENTINEL LYMPH NODE BIOPSY Right 06/01/2016   Procedure: BREAST LUMPECTOMY WITH SENTINEL LYMPH NODE BX;  Surgeon: Robert Bellow, MD;  Location: [facility] ORS;  Service: General;  Laterality: Right;    Family History  Problem Relation Age of Onset  . Breast cancer Sister 14  . Cancer Brother        brain  . Cancer Sister   . Non-Hodgkin's lymphoma Brother     Social History Social History   Tobacco Use  . Smoking status: Never Smoker  . Smokeless tobacco: Never Used  Substance Use Topics  . Alcohol use: No    Alcohol/week: 0.0 oz  . Drug use: No    Allergies  Allergen Reactions  . Sulphadimidine [Sulfamethazine] Swelling  . Petrolatum-Zinc Oxide Swelling    Current Outpatient Medications  Medication Sig Dispense Refill  . aspirin 81 MG chewable tablet Chew 81 mg by mouth at bedtime.     Marland Kitchen atorvastatin (LIPITOR) 80 MG tablet TAKE 1 TABLET (80 MG TOTAL) BY MOUTH ONCE DAILY.  1  .  carvedilol (COREG) 6.25 MG tablet Take by mouth.    . furosemide (LASIX) 20 MG tablet Take 20 mg by mouth daily as needed for fluid.     Marland Kitchen letrozole (FEMARA) 2.5 MG tablet Take 1 tablet (2.5 mg total) by mouth daily. 90 tablet 3  . pantoprazole (PROTONIX) 40 MG tablet Take 40 mg by mouth every morning.     . niacin (NIASPAN) 500 MG CR tablet Take 1 mg by mouth at bedtime.      No current facility-administered medications for this visit.     Review of Systems Review of Systems  Constitutional: Negative.   Respiratory: Negative.   Cardiovascular: Negative.     Blood pressure 138/80, pulse 72, resp. rate 14, height 5\' 6"  (1.676 m), weight 185 lb (83.9 kg).  Physical Exam Physical Exam  Constitutional: She is oriented to person, place, and time. She appears well-developed and well-nourished.  Eyes: Conjunctivae are normal. No scleral icterus.  Neck: Neck supple.  Cardiovascular: Normal rate, regular rhythm and normal heart sounds.  Left leg swelling.  Pulmonary/Chest: Effort normal and breath sounds normal. Right breast exhibits no inverted nipple, no mass, no nipple discharge, no skin change and no tenderness. Left breast exhibits no inverted nipple, no mass, no nipple discharge, no skin change and no  tenderness.    Right breast incision is well healed.   Lymphadenopathy:    She has no cervical adenopathy.    She has no axillary adenopathy.  Neurological: She is alert and oriented to person, place, and time.  Skin: Skin is warm and dry.   Drained 5cc of fluid.  Data Reviewed Bilateral mammogram and right ultrasound dated April 26, 2017 completed at the hospital reviewed.  Decrease in size of right breast seroma.  In light of the patient's daily/every other day nipple drainage repeat ultrasound for possible aspiration of the seroma was recommended.  Ultrasound examination shows a prominent duct extending from the area of the seroma in the 3 o'clock position of the right breast  extending from 4-7 cm from the nipple.  ChloraPrep was applied followed by 1 cc of 1% plain Xylocaine.  5 cc of clear fluid matching that expressed from the nipple was retrieved.  The seroma cavity collapsed completely.  The procedure was well tolerated.  Assessment    Breast seroma with continuity with the ductal system.    Plan    I anticipate this will resolve with time.      Patient to return in one month. Report if there is an increase rate of drainage. The patient is aware to call back for any questions or concerns.   HPI, Physical Exam, Assessment and Plan have been scribed under the direction and in the presence of Hervey Ard, MD.  Gaspar Cola, CMA  I have completed the exam and reviewed the above documentation for accuracy and completeness.  I agree with the above.  Haematologist has been used and any errors in dictation or transcription are unintentional.  Hervey Ard, M.D., F.A.C.S.  Courtney Herrera 05/03/2017, 8:16 PM

## 2017-05-03 NOTE — Patient Instructions (Addendum)
Patient to return in  one month.  Report if there is any drainage. The patient is aware to call back for any questions or concerns.

## 2017-05-13 NOTE — Progress Notes (Signed)
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 High risk Oncotype with recurrence score of 44.  INTERVAL HISTORY: Patient returns to clinic today for routine 52-monthevaluation.  She is tolerating letrozole well without significant side effects.  She continues to have a recurrent seroma and recently had a drainage on May 03, 2017.  She has no neurologic complaints. She denies any recent fevers or illnesses. She has a good appetite and denies weight loss. She has no chest pain or shortness of breath. She denies any nausea, vomiting, constipation, or diarrhea. She has no urinary complaints. Patient offers no further specific complaints today.  REVIEW OF SYSTEMS:   Review of Systems  Constitutional: Negative.  Negative for fever, malaise/fatigue and weight loss.  Respiratory: Negative.  Negative for cough and shortness of breath.   Cardiovascular: Negative.  Negative for chest pain and leg swelling.  Gastrointestinal: Negative.  Negative for abdominal pain.  Genitourinary: Negative.   Musculoskeletal: Negative.   Skin: Negative.  Negative for rash.  Neurological: Negative.  Negative for weakness.  Psychiatric/Behavioral: Negative.  The patient is not nervous/anxious.     As per HPI. Otherwise, a complete review of systems is negative.  PAST MEDICAL HISTORY: Past Medical History:  Diagnosis Date  . Breast cancer ([place] 05/11/2016   7 mm ER 90%; PR neg, Her 2 neu not  overexpressed, Mammoprint: High risk. Whole breast radiation.  . Cancer ([facility] 2012   uterine- radiation  . GERD (gastroesophageal reflux disease)   . Hyperlipidemia   . Personal history of chemotherapy   . Personal history of radiation therapy     PAST SURGICAL HISTORY: Past Surgical History:  Procedure Laterality Date  . ABDOMINAL HYSTERECTOMY  2012  . BREAST BIOPSY Left    neg  . BREAST BIOPSY Right 05/11/2016   INVASIVE MAMMARY CARCINOMA  . BREAST CYST ASPIRATION Left    neg  . BREAST EXCISIONAL BIOPSY Right 06/01/2016   + lumpectomy  . BREAST LUMPECTOMY WITH SENTINEL LYMPH NODE BIOPSY Right 06/01/2016   Procedure: BREAST LUMPECTOMY WITH SENTINEL LYMPH NODE BX;  Surgeon: JRobert Bellow MD;  Location: [facility] ORS;  Service: General;  Laterality: Right;    FAMILY HISTORY: Family History  Problem Relation Age of Onset  . Breast cancer Sister 68 . Cancer Brother        brain  . Cancer Sister   . Non-Hodgkin's lymphoma Brother     ADVANCED DIRECTIVES (Y/N):  N  HEALTH MAINTENANCE: Social History   Tobacco Use  . Smoking status: Never Smoker  . Smokeless tobacco: Never Used  Substance Use Topics  . Alcohol use: No    Alcohol/week: 0.0 oz  . Drug use: No     Colonoscopy:  PAP:  Bone density:  Lipid panel:  Allergies  Allergen Reactions  . Sulphadimidine [Sulfamethazine] Swelling  . Petrolatum-Zinc Oxide Swelling    Current Outpatient Medications  Medication Sig Dispense Refill  . aspirin 81 MG chewable tablet Chew 81 mg by mouth at bedtime.     .Marland Kitchenatorvastatin (LIPITOR)  80 MG tablet TAKE 1 TABLET (80 MG TOTAL) BY MOUTH ONCE DAILY.  1  . carvedilol (COREG) 6.25 MG tablet Take by mouth.    . furosemide (LASIX) 20 MG tablet Take 20 mg by mouth daily as needed for fluid.     Marland Kitchen letrozole (FEMARA) 2.5 MG tablet Take 1 tablet (2.5 mg total) by mouth daily. 90 tablet 3  . niacin (NIASPAN) 500 MG CR tablet Take 1 mg by mouth at bedtime.     . pantoprazole  (PROTONIX) 40 MG tablet Take 40 mg by mouth every morning.      No current facility-administered medications for this visit.     OBJECTIVE: Vitals:   05/17/17 1449  BP: (!) 169/84  Pulse: 71  Resp: 16     Body mass index is 30.44 kg/m.    ECOG FS:0 - Asymptomatic  General: Well-developed, well-nourished, no acute distress. Eyes: Pink conjunctiva, anicteric sclera. Breasts: Patient requested exam be deferred today after recent drainage of seroma. Lungs: Clear to auscultation bilaterally. Heart: Regular rate and rhythm. No rubs, murmurs, or gallops. Abdomen: Soft, nontender, nondistended. No organomegaly noted, normoactive bowel sounds. Musculoskeletal: No edema, cyanosis, or clubbing. Neuro: Alert, answering all questions appropriately. Cranial nerves grossly intact. Skin: No rashes or petechiae noted. Psych: Normal affect.   LAB RESULTS:  Lab Results  Component Value Date   NA 139 10/17/2016   K 4.4 10/17/2016   CL 105 10/17/2016   CO2 27 10/17/2016   GLUCOSE 110 (H) 10/17/2016   BUN 28 (H) 10/17/2016   CREATININE 0.79 10/17/2016   CALCIUM 8.9 10/17/2016   PROT 6.3 (L) 10/17/2016   ALBUMIN 3.7 10/17/2016   AST 14 (L) 10/17/2016   ALT 13 (L) 10/17/2016   ALKPHOS 64 10/17/2016   BILITOT 0.5 10/17/2016   GFRNONAA >60 10/17/2016   GFRAA >60 10/17/2016    Lab Results  Component Value Date   WBC 3.4 (L) 11/09/2016   NEUTROABS 3.1 09/05/2016   HGB 12.1 11/09/2016   HCT 35.7 11/09/2016   MCV 89.3 11/09/2016   PLT 173 11/09/2016     STUDIES: US Breast Complete Uni Right Inc Axilla  Result Date: 05/03/2017 In light of the patient's daily/every other day nipple drainage repeat ultrasound for possible aspiration of the seroma was recommended. Ultrasound examination shows a prominent duct extending from the area of the seroma in the 3 o'clock position of the right breast extending from 4-7 cm from the nipple.  ChloraPrep was applied followed by 1 cc of 1% plain  Xylocaine.  5 cc of clear fluid matching that expressed from the nipple was retrieved.  The seroma cavity collapsed completely.  The procedure was well tolerated.   US Breast Ltd Uni Right Inc Axilla  Result Date: 04/26/2017 CLINICAL DATA:  77 year old female with complaints of right nipple discharge for several months and for annual bilateral mammograms. History of right breast cancer, lumpectomy and radiation treatment with subsequent breast collection drainage. EXAM: 2D DIGITAL DIAGNOSTIC BILATERAL MAMMOGRAM WITH CAD AND ADJUNCT TOMO ULTRASOUND RIGHT BREAST COMPARISON:  Previous exam(s) including 01/25/2017 ultrasound from [facility]. ACR Breast Density Category b: There are scattered areas of fibroglandular density. FINDINGS: 2D and 3D full field views of both breasts and a magnification view of the lumpectomy site demonstrate no suspicious mass, nonsurgical distortion or worrisome calcifications bilaterally. A circumscribed oval mass at the right lumpectomy site is noted. Lumpectomy and radiation changes within the right breast are again noted. Mammographic images were processed with  CAD. Targeted ultrasound is performed, showing a 2 x 1.2 x 2.2 cm postoperative collection at the 8:30 position of the right breast 8 cm from the nipple. This collection measured up to 4.5 cm on 01/25/2017. No abnormalities are identified in the retroareolar right breast. IMPRESSION: 1. No mammographic evidence of breast malignancy. 2. Right breast lumpectomy and radiation changes. 2.2 cm postoperative collection within the outer right breast previously measured 4.5 cm on 01/25/2017. 3. No mammographic or sonographic abnormalities within the retroareolar right breast to account for patient's nipple discharge. Consider clinical follow-up as indicated. RECOMMENDATION: Bilateral diagnostic mammograms in 1 year. I have discussed the findings and recommendations with the patient. Results were also provided in  writing at the conclusion of the visit. If applicable, a reminder letter will be sent to the patient regarding the next appointment. BI-RADS CATEGORY  2: Benign. Electronically Signed   By: Margarette Canada M.D.   On: 04/26/2017 10:38   Mm Diag Breast Tomo Bilateral  Result Date: 04/26/2017 CLINICAL DATA:  77 year old female with complaints of right nipple discharge for several months and for annual bilateral mammograms. History of right breast cancer, lumpectomy and radiation treatment with subsequent breast collection drainage. EXAM: 2D DIGITAL DIAGNOSTIC BILATERAL MAMMOGRAM WITH CAD AND ADJUNCT TOMO ULTRASOUND RIGHT BREAST COMPARISON:  Previous exam(s) including 01/25/2017 ultrasound from [facility]. ACR Breast Density Category b: There are scattered areas of fibroglandular density. FINDINGS: 2D and 3D full field views of both breasts and a magnification view of the lumpectomy site demonstrate no suspicious mass, nonsurgical distortion or worrisome calcifications bilaterally. A circumscribed oval mass at the right lumpectomy site is noted. Lumpectomy and radiation changes within the right breast are again noted. Mammographic images were processed with CAD. Targeted ultrasound is performed, showing a 2 x 1.2 x 2.2 cm postoperative collection at the 8:30 position of the right breast 8 cm from the nipple. This collection measured up to 4.5 cm on 01/25/2017. No abnormalities are identified in the retroareolar right breast. IMPRESSION: 1. No mammographic evidence of breast malignancy. 2. Right breast lumpectomy and radiation changes. 2.2 cm postoperative collection within the outer right breast previously measured 4.5 cm on 01/25/2017. 3. No mammographic or sonographic abnormalities within the retroareolar right breast to account for patient's nipple discharge. Consider clinical follow-up as indicated. RECOMMENDATION: Bilateral diagnostic mammograms in 1 year. I have discussed the findings and  recommendations with the patient. Results were also provided in writing at the conclusion of the visit. If applicable, a reminder letter will be sent to the patient regarding the next appointment. BI-RADS CATEGORY  2: Benign. Electronically Signed   By: Margarette Canada M.D.   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 High risk Oncotype with recurrence score of 44.   PLAN:    1. 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 Her 10 year risk of distant recurrence with tamoxifen alone was approximately 30%, therefore adjuvant chemotherapy using Taxotere and Cytoxan was recommended. Patient completed treatment on Sep 05, 2016.  She has also completed adjuvant XRT.  Her most recent mammogram on April 26, 2017 was reported as BI-RADS 2.  Continue letrozole which she will take for 5 years completing treatment in August 2023.  Return to clinic in 6 months for further evaluation. 2.  Seroma: Continue management and treatment per surgery. 3.  Osteopenia: Patient's most recent bone mineral density on November 30, 2016 reported a T score of -2.2.  She has been instructed to continue calcium vitamin D supplementation.  Repeat in August 2019.   Approximately 20 minutes was spent in discussion of which greater than 50% was consultation.  Patient expressed understanding and was in agreement with this plan. She also understands that She can call clinic at any time with any questions, concerns, or complaints.   Cancer Staging Primary cancer of upper outer quadrant of right female breast ([place]) HER 2 NEGATIVE  ([facility]) Staging form: Breast, AJCC 8th Edition - Pathologic stage from 06/12/2016: Stage IA (pT1c, pN0, cM0, G2, ER:  Positive, PR: Negative, HER2: Negative) - Signed by Lloyd Huger, MD on 06/12/2016   Lloyd Huger, MD   05/18/2017 1:46 PM

## 2017-05-17 ENCOUNTER — Inpatient Hospital Stay: Payer: Medicare HMO | Attending: Oncology | Admitting: Oncology

## 2017-05-17 ENCOUNTER — Inpatient Hospital Stay: Payer: Medicare HMO

## 2017-05-17 VITALS — BP 169/84 | HR 71 | Resp 16 | Wt 188.6 lb

## 2017-05-17 DIAGNOSIS — M8580 Other specified disorders of bone density and structure, unspecified site: Secondary | ICD-10-CM | POA: Diagnosis not present

## 2017-05-17 DIAGNOSIS — C50411 Malignant neoplasm of upper-outer quadrant of right female breast: Secondary | ICD-10-CM | POA: Diagnosis not present

## 2017-05-17 DIAGNOSIS — Z170 Estrogen receptor positive status [ER+]: Secondary | ICD-10-CM | POA: Diagnosis not present

## 2017-05-17 DIAGNOSIS — N6489 Other specified disorders of breast: Secondary | ICD-10-CM | POA: Diagnosis not present

## 2017-05-17 DIAGNOSIS — Z79811 Long term (current) use of aromatase inhibitors: Secondary | ICD-10-CM | POA: Diagnosis not present

## 2017-05-17 NOTE — Progress Notes (Signed)
Survivorship Care Plan visit completed.  Treatment summary reviewed and given to patient.  ASCO answers booklet reviewed and given to patient.  CARE program and Cancer Transitions discussed with patient along with other resources cancer center offers to patients and caregivers.  Patient verbalized understanding.    

## 2017-05-31 ENCOUNTER — Encounter: Payer: Self-pay | Admitting: Radiation Oncology

## 2017-05-31 ENCOUNTER — Ambulatory Visit
Admission: RE | Admit: 2017-05-31 | Discharge: 2017-05-31 | Disposition: A | Discharge status: Home / Self Care | Payer: Medicare HMO | Source: Ambulatory Visit | Attending: Radiation Oncology | Admitting: Radiation Oncology

## 2017-05-31 ENCOUNTER — Other Ambulatory Visit: Payer: Self-pay

## 2017-05-31 VITALS — BP 136/91 | HR 94 | Temp 97.3°F | Resp 18 | Wt 185.7 lb

## 2017-05-31 DIAGNOSIS — C50411 Malignant neoplasm of upper-outer quadrant of right female breast: Secondary | ICD-10-CM | POA: Diagnosis not present

## 2017-05-31 DIAGNOSIS — Z79811 Long term (current) use of aromatase inhibitors: Secondary | ICD-10-CM | POA: Diagnosis not present

## 2017-05-31 DIAGNOSIS — Z170 Estrogen receptor positive status [ER+]: Secondary | ICD-10-CM | POA: Diagnosis not present

## 2017-05-31 DIAGNOSIS — Z923 Personal history of irradiation: Secondary | ICD-10-CM | POA: Insufficient documentation

## 2017-05-31 NOTE — Progress Notes (Signed)
Radiation Oncology Follow up Note  Name: Courtney Herrera   Date:   05/31/2017 MRN:  [id number] DOB: 1940/08/12    This 77 y.o. 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  REFERRING PROVIDER: Kirk Ruths, MD  HPI: Patient is a 55 roll female now seen out 6 months having completed whole breast radiation to her right breast for a stage IA ER/PR and HER-2/neu negative invasive mammary carcinoma of the right breast status post wide local excision. She also underwent Cytoxan and Taxotere.. She does have a seroma the right breast which is being followed occasionally drained by surgeon. Her most recent mammogram back in January 2019 was BI-RADS 2 benign. I have reviewed that mammogram. She is also on letrozole tongue that well without side effect.  COMPLICATIONS OF TREATMENT: none  FOLLOW UP COMPLIANCE: keeps appointments   PHYSICAL EXAM:  BP (!) 136/91   Pulse 94   Temp (!) 97.3 F (36.3 C)   Resp 18   Wt 185 lb 11.8 oz (84.2 kg)   BMI 29.98 kg/m  Patient does have a large seroma the lateral portion of the right breast. No other mass or nodularity is noted in either breast in 2 positions examined. No axillary or supraclavicular adenopathy is appreciated. Well-developed well-nourished patient in NAD. HEENT reveals PERLA, EOMI, discs not visualized.  Oral cavity is clear. No oral mucosal lesions are identified. Neck is clear without evidence of cervical or supraclavicular adenopathy. Lungs are clear to A&P. Cardiac examination is essentially unremarkable with regular rate and rhythm without murmur rub or thrill. Abdomen is benign with no organomegaly or masses noted. Motor sensory and DTR levels are equal and symmetric in the upper and lower extremities. Cranial nerves II through XII are grossly intact. Proprioception is intact. No  peripheral adenopathy or edema is identified. No motor or sensory levels are noted. Crude visual fields are within normal range.  RADIOLOGY RESULTS: Mammogram reviewed and compatible above-stated findings  PLAN: Present time patient is doing well. She continues surgical management of her seroma. It might just self resolve. Otherwise she continues on letrozole. She's ordered and scheduled for follow-up mammograms. I will see her back in 6 months and then go to once your follow-up appointments. Patient knows to call with any concerns.  I would like to take this opportunity to thank you for allowing me to participate in the care of your patient.Noreene Filbert, MD

## 2017-06-07 ENCOUNTER — Ambulatory Visit (INDEPENDENT_AMBULATORY_CARE_PROVIDER_SITE_OTHER): Payer: Medicare HMO | Admitting: General Surgery

## 2017-06-07 ENCOUNTER — Encounter: Payer: Self-pay | Admitting: General Surgery

## 2017-06-07 VITALS — BP 130/74 | HR 69 | Resp 12 | Ht 64.0 in | Wt 186.0 lb

## 2017-06-07 DIAGNOSIS — N6489 Other specified disorders of breast: Secondary | ICD-10-CM

## 2017-06-07 DIAGNOSIS — C50911 Malignant neoplasm of unspecified site of right female breast: Secondary | ICD-10-CM | POA: Diagnosis not present

## 2017-06-07 DIAGNOSIS — Z170 Estrogen receptor positive status [ER+]: Secondary | ICD-10-CM

## 2017-06-07 NOTE — Progress Notes (Signed)
Patient ID: Courtney Herrera, female   DOB: 06-15-1940, 77 y.o.   MRN: [id number]  Chief Complaint  Patient presents with  . Follow-up    HPI Courtney Herrera is a 77 y.o. female here today for her one month follow up right nipple discharge. Patient states the discharge has stopped. Tenderness under her right armpit. Saw Dr. Baruch Gouty on 05/31/2017.  HPI  Past Medical History:  Diagnosis Date  . Breast cancer ([place]) 05/11/2016   7 mm ER 90%; PR neg, Her 2 neu not overexpressed, Mammoprint: High risk. Whole breast radiation.  . Cancer [facility]) 2012   uterine- radiation  . GERD (gastroesophageal reflux disease)   . Hyperlipidemia   . Personal history of chemotherapy   . Personal history of radiation therapy     Past Surgical History:  Procedure Laterality Date  . ABDOMINAL HYSTERECTOMY  2012  . BREAST BIOPSY Left    neg  . BREAST BIOPSY Right 05/11/2016   INVASIVE MAMMARY CARCINOMA  . BREAST CYST ASPIRATION Left    neg  . BREAST EXCISIONAL BIOPSY Right 06/01/2016   + lumpectomy  . BREAST LUMPECTOMY WITH SENTINEL LYMPH NODE BIOPSY Right 06/01/2016   Procedure: BREAST LUMPECTOMY WITH SENTINEL LYMPH NODE BX;  Surgeon: Robert Bellow, MD;  Location: [facility] ORS;  Service: General;  Laterality: Right;  . COLONOSCOPY  2015    Family History  Problem Relation Age of Onset  . Breast cancer Sister 42  . Cancer Brother        brain  . Cancer Sister   . Non-Hodgkin's lymphoma Brother     Social History Social History   Tobacco Use  . Smoking status: Never Smoker  . Smokeless tobacco: Never Used  Substance Use Topics  . Alcohol use: No    Alcohol/week: 0.0 oz  . Drug use: No    Allergies  Allergen Reactions  . Sulphadimidine [Sulfamethazine] Swelling  . Petrolatum-Zinc Oxide Swelling    Current Outpatient Medications  Medication Sig Dispense Refill  . aspirin 81 MG chewable tablet Chew 81 mg by mouth at bedtime.     Marland Kitchen atorvastatin (LIPITOR) 80 MG tablet TAKE 1 TABLET (80 MG  TOTAL) BY MOUTH ONCE DAILY.  1  . carvedilol (COREG) 6.25 MG tablet Take by mouth.    . furosemide (LASIX) 20 MG tablet Take 20 mg by mouth daily as needed for fluid.     Marland Kitchen letrozole (FEMARA) 2.5 MG tablet Take 1 tablet (2.5 mg total) by mouth daily. 90 tablet 3  . pantoprazole (PROTONIX) 40 MG tablet Take 40 mg by mouth every morning.     . niacin (NIASPAN) 500 MG CR tablet Take 1 mg by mouth at bedtime.      No current facility-administered medications for this visit.     Review of Systems Review of Systems  Constitutional: Negative.   Respiratory: Negative.   Cardiovascular: Negative.     Blood pressure 130/74, pulse 69, resp. rate 12, height 5\' 4"  (1.626 m), weight 186 lb (84.4 kg).  Physical Exam Physical Exam  Constitutional: She is oriented to person, place, and time. She appears well-developed and well-nourished.  Eyes: Conjunctivae are normal. No scleral icterus.  Neck: Neck supple.  Cardiovascular: Normal rate, regular rhythm and normal heart sounds.  Pulmonary/Chest: Effort normal and breath sounds normal. Right breast exhibits no inverted nipple, no mass, no nipple discharge, no skin change and no tenderness. Left breast exhibits no inverted nipple, no mass, no nipple discharge, no skin change  and no tenderness.    Lymphadenopathy:    She has no cervical adenopathy.    She has no axillary adenopathy.  Neurological: She is alert and oriented to person, place, and time.  Skin: Skin is warm and dry.   Radiation oncology notes of May 31, 2017 reviewed. Medical oncology notes of May 17, 2017 reviewed.  Assessment    Apparent resolution of post wide excision seroma.    Plan  Patient to return in 6 months no mammogram. . The patient is aware to call back for any questions or concerns.   HPI, Physical Exam, Assessment and Plan have been scribed under the direction and in the presence of Hervey Ard, MD.  Gaspar Cola, CMA  I have completed the  exam and reviewed the above documentation for accuracy and completeness.  I agree with the above.  Haematologist has been used and any errors in dictation or transcription are unintentional.  Hervey Ard, M.D., F.A.C.S.   Forest Gleason Byrnett 06/07/2017, 11:25 AM

## 2017-06-07 NOTE — Patient Instructions (Signed)
  Patient to return in 6 months no mammo. . The patient is aware to call back for any questions or concerns.

## 2017-07-02 IMAGING — MG MM BREAST LOCALIZATION CLIP
2 series · 2 of 2 positions shown · non-contrast
Comparison: Previous exam(s).

CLINICAL DATA: Status post ultrasound-guided right breast biopsy

EXAM:
DIAGNOSTIC RIGHT MAMMOGRAM POST ULTRASOUND BIOPSY

[R ML]
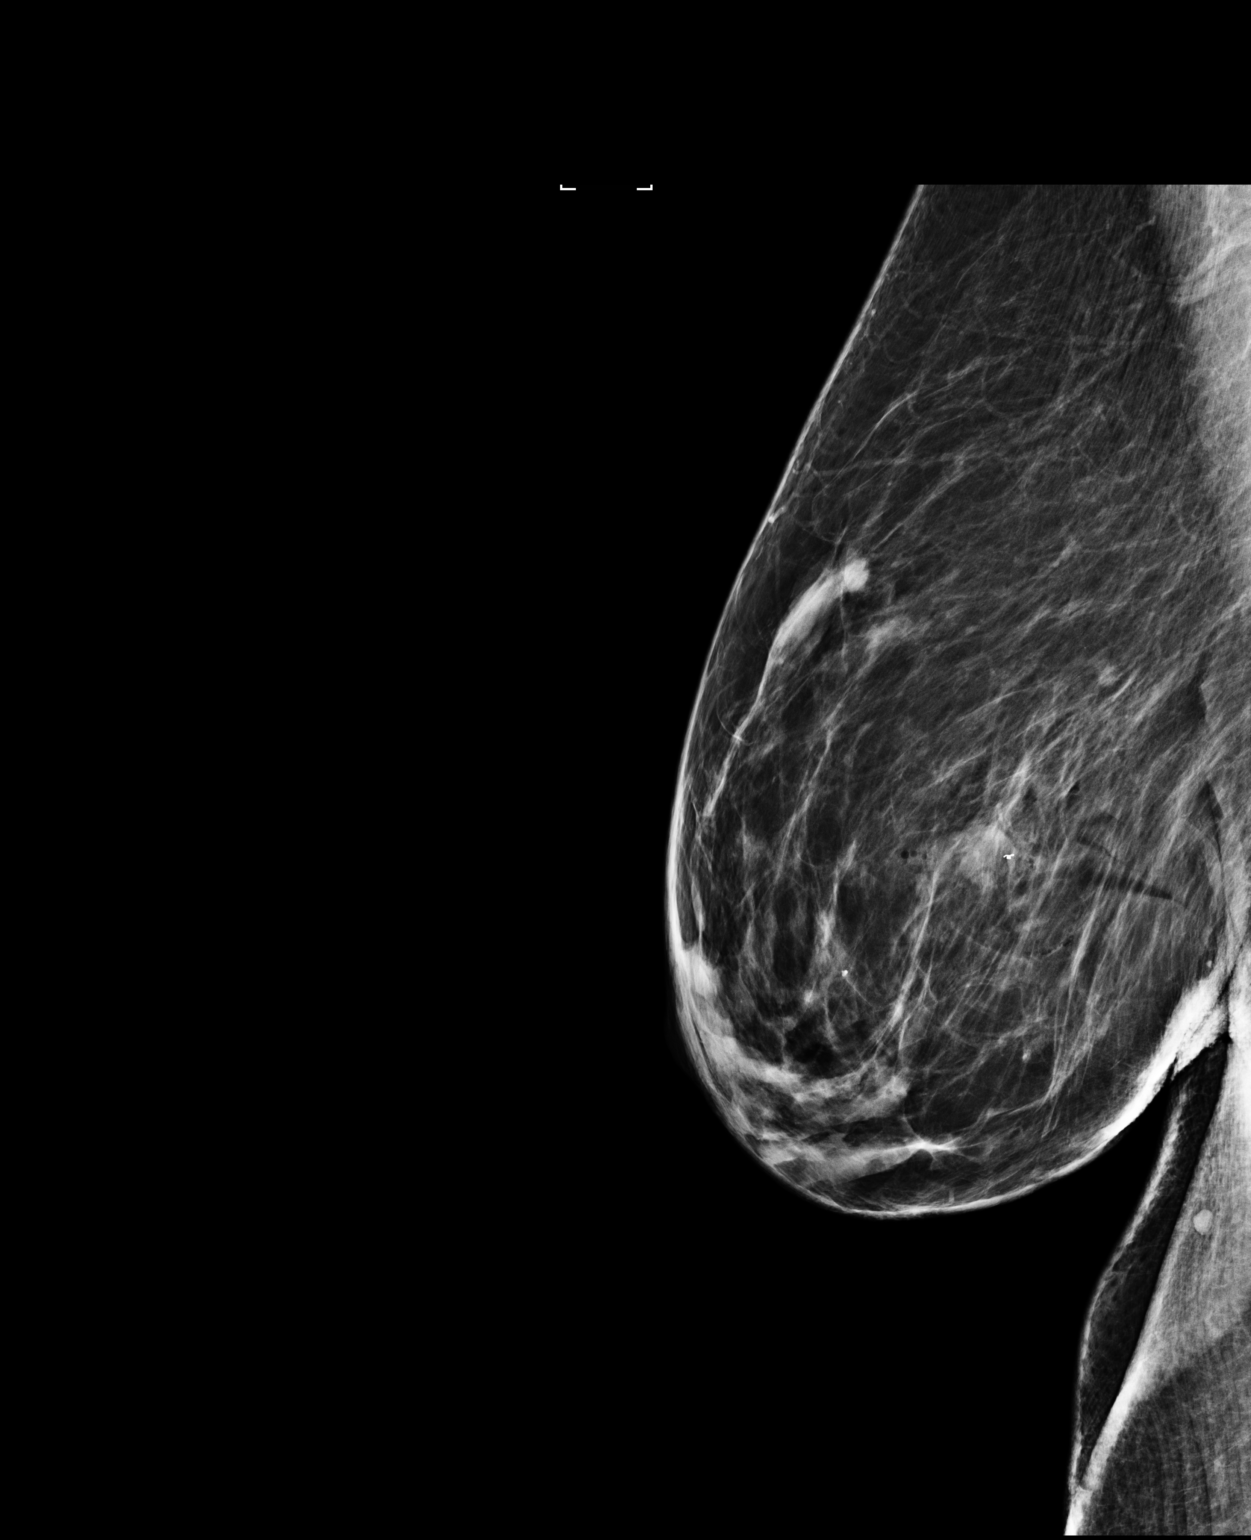

[R CC]
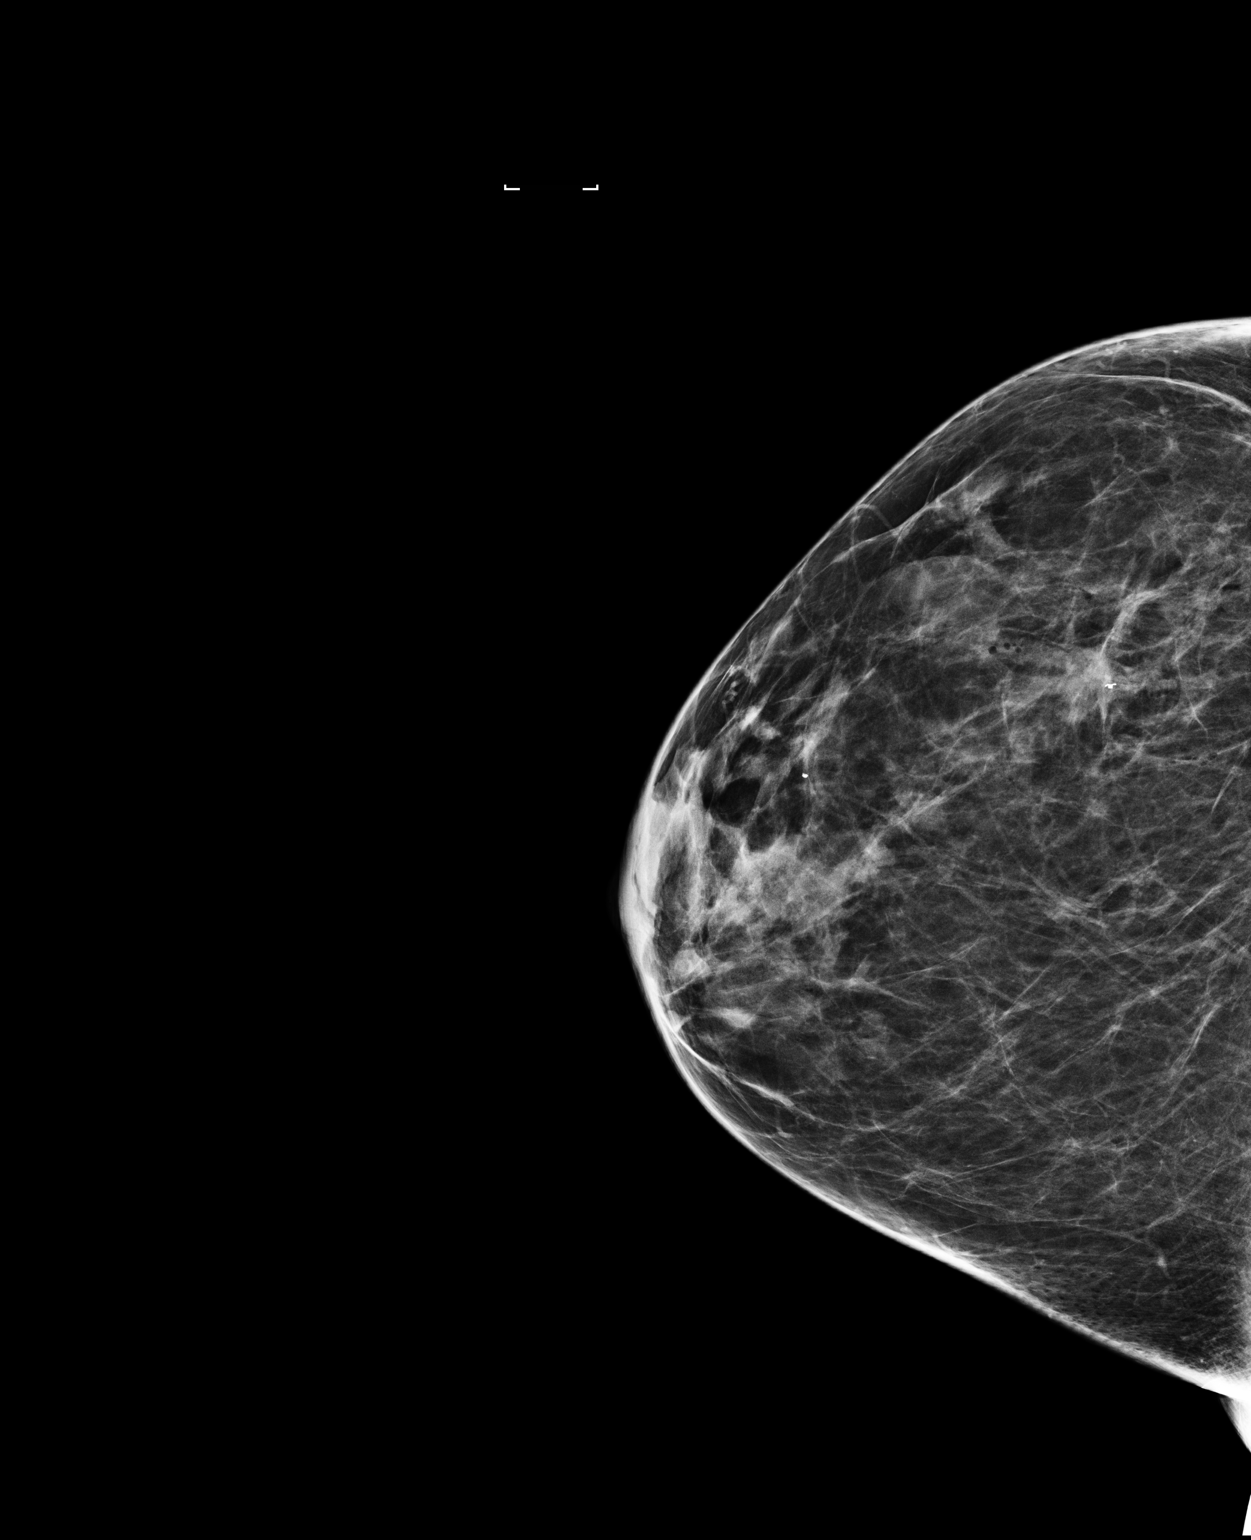

[2 of 2 positions shown; findings below may reference images not displayed]

FINDINGS: Mammographic images were obtained following ultrasound guided biopsy
of an indeterminate right breast mass at 9 o'clock, 5 cm from the
nipple. Post biopsy mammogram demonstrates the HydroMARK shape 4
butterfly marker in the expected location within the right breast
mass at o'clock.
IMPRESSION: Appropriate marker position as above.

Final Assessment: Post Procedure Mammograms for Marker Placement

## 2017-10-31 ENCOUNTER — Encounter: Payer: Self-pay | Admitting: *Deleted

## 2017-11-04 ENCOUNTER — Other Ambulatory Visit: Payer: Self-pay | Admitting: Oncology

## 2017-11-15 ENCOUNTER — Ambulatory Visit: Payer: Medicare HMO | Admitting: Oncology

## 2017-12-09 NOTE — Progress Notes (Signed)
Courtney Herrera  [phone] [phone]) [phone]  ID: Ledon Snare OB: 03/31/1941  MR#: [id number]  EVO#:[id number]  Patient Care Team: Kirk Ruths, MD as PCP - General (Internal Medicine) Rico Junker, RN as Oncology Nurse Navigator Byrnett, Forest Gleason, MD (General Surgery) Kirk Ruths, MD (Internal Medicine) Lloyd Huger, MD as Consulting Physician (Oncology)  CHIEF COMPLAINT: Pathologic stage Ia ER positive, PR negative, HER-2 negative invasive carcinoma of the upper outer quadrant of the right breast. High risk Oncotype with recurrence score of 44.  INTERVAL HISTORY: Patient returns to clinic today for routine six-month evaluation.  She continues to tolerate letrozole well without significant side effects.  She currently feels well and is asymptomatic. She has no neurologic complaints. She denies any recent fevers or illnesses. She has a good appetite and denies weight loss. She has no chest pain or shortness of breath. She denies any nausea, vomiting, constipation, or diarrhea. She has no urinary complaints.  Patient feels at her baseline offers no specific complaints today.  REVIEW OF SYSTEMS:   Review of Systems  Constitutional: Negative.  Negative for fever, malaise/fatigue and weight loss.  Respiratory: Negative.  Negative for cough and shortness of breath.   Cardiovascular: Negative.  Negative for chest pain and leg swelling.  Gastrointestinal: Negative.  Negative for abdominal pain.  Genitourinary: Negative.  Negative for dysuria.  Musculoskeletal: Negative.  Negative for back pain.  Skin: Negative.  Negative for rash.  Neurological: Negative.  Negative for focal weakness, weakness and headaches.  Psychiatric/Behavioral: Negative.  The patient is not nervous/anxious.     As per HPI. Otherwise, a complete review of systems is negative.  PAST MEDICAL HISTORY: Past Medical History:  Diagnosis Date  . Breast cancer  ([place]) 05/11/2016   7 mm ER 90%; PR neg, Her 2 neu not overexpressed, Mammoprint: High risk. Whole breast radiation.  . Cancer [facility]) 2012   uterine- radiation  . GERD (gastroesophageal reflux disease)   . Hyperlipidemia   . Personal history of chemotherapy   . Personal history of radiation therapy     PAST SURGICAL HISTORY: Past Surgical History:  Procedure Laterality Date  . ABDOMINAL HYSTERECTOMY  2012  . BREAST BIOPSY Left    neg  . BREAST BIOPSY Right 05/11/2016   INVASIVE MAMMARY CARCINOMA  . BREAST CYST ASPIRATION Left    neg  . BREAST EXCISIONAL BIOPSY Right 06/01/2016   + lumpectomy  . BREAST LUMPECTOMY WITH SENTINEL LYMPH NODE BIOPSY Right 06/01/2016   Procedure: BREAST LUMPECTOMY WITH SENTINEL LYMPH NODE BX;  Surgeon: Robert Bellow, MD;  Location: [facility] ORS;  Service: General;  Laterality: Right;  . COLONOSCOPY  2015    FAMILY HISTORY: Family History  Problem Relation Age of Onset  . Breast cancer Sister 54  . Cancer Brother        brain  . Cancer Sister   . Non-Hodgkin's lymphoma Brother     ADVANCED DIRECTIVES (Y/N):  N  HEALTH MAINTENANCE: Social History   Tobacco Use  . Smoking status: Never Smoker  . Smokeless tobacco: Never Used  Substance Use Topics  . Alcohol use: No    Alcohol/week: 0.0 standard drinks  . Drug use: No     Colonoscopy:  PAP:  Bone density:  Lipid panel:  Allergies  Allergen Reactions  . Sulphadimidine [Sulfamethazine] Swelling  . Petrolatum-Zinc Oxide Swelling    Current Outpatient Medications  Medication Sig Dispense Refill  . aspirin 81 MG chewable tablet Chew 81  mg by mouth at bedtime.     Marland Kitchen atorvastatin (LIPITOR) 80 MG tablet TAKE 1 TABLET (80 MG TOTAL) BY MOUTH ONCE DAILY.  1  . carvedilol (COREG) 6.25 MG tablet Take by mouth.    . furosemide (LASIX) 20 MG tablet Take 20 mg by mouth daily as needed for fluid.     Marland Kitchen letrozole (FEMARA) 2.5 MG tablet TAKE 1 TABLET BY MOUTH EVERY DAY 90 tablet 0  . niacin  (NIASPAN) 500 MG CR tablet Take 1 mg by mouth at bedtime.     . pantoprazole (PROTONIX) 40 MG tablet Take 40 mg by mouth every morning.      No current facility-administered medications for this visit.     OBJECTIVE: Vitals:   12/13/17 1001 12/13/17 1006  BP:  134/72  Pulse:  69  Resp: 12   Temp:  (!) 97 F (36.1 C)     Body mass index is 32.61 kg/m.    ECOG FS:0 - Asymptomatic  General: Well-developed, well-nourished, no acute distress. Eyes: Pink conjunctiva, anicteric sclera. HEENT: Normocephalic, moist mucous membranes. Breast: Patient reports a normal breast exam by another provider earlier today. Lungs: Clear to auscultation bilaterally. Heart: Regular rate and rhythm. No rubs, murmurs, or gallops. Abdomen: Soft, nontender, nondistended. No organomegaly noted, normoactive bowel sounds. Musculoskeletal: No edema, cyanosis, or clubbing. Neuro: Alert, answering all questions appropriately. Cranial nerves grossly intact. Skin: No rashes or petechiae noted. Psych: Normal affect.  LAB RESULTS:  Lab Results  Component Value Date   NA 139 10/17/2016   K 4.4 10/17/2016   CL 105 10/17/2016   CO2 27 10/17/2016   GLUCOSE 110 (H) 10/17/2016   BUN 28 (H) 10/17/2016   CREATININE 0.79 10/17/2016   CALCIUM 8.9 10/17/2016   PROT 6.3 (L) 10/17/2016   ALBUMIN 3.7 10/17/2016   AST 14 (L) 10/17/2016   ALT 13 (L) 10/17/2016   ALKPHOS 64 10/17/2016   BILITOT 0.5 10/17/2016   GFRNONAA >60 10/17/2016   GFRAA >60 10/17/2016    Lab Results  Component Value Date   WBC 3.4 (L) 11/09/2016   NEUTROABS 3.1 09/05/2016   HGB 12.1 11/09/2016   HCT 35.7 11/09/2016   MCV 89.3 11/09/2016   PLT 173 11/09/2016     STUDIES: No results found.  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 High risk Oncotype with recurrence score of 44.   PLAN:    1. Pathologic stage Ia ER positive, PR  negative, HER-2 negative invasive carcinoma of the upper outer quadrant of the right breast: Patient was noted to have a Oncotype DX recurrence score 44 which is considered high risk. Her 10 year risk of distant recurrence with tamoxifen alone was approximately 30%, therefore adjuvant chemotherapy using Taxotere and Cytoxan was recommended. Patient completed treatment on Sep 05, 2016.  She has also completed adjuvant XRT.  Her most recent mammogram on April 26, 2017 was reported as BI-RADS 2.  Repeat mammogram in January 2020.  Continue letrozole for a total of 5 years completing in August 2023.  Return to clinic in 6 months for routine evaluation. 2.  Seroma: Resolved. 3.  Osteopenia: Patient's most recent bone mineral density on November 30, 2016 reported a T score of -2.2.  Patient will require repeat bone marrow density in the next 1 to 2 weeks.  Continue calcium and vitamin D supplementation.    Patient expressed understanding and was in agreement  with this plan. She also understands that She can call clinic at any time with any questions, concerns, or complaints.   Cancer Staging Primary cancer of upper outer quadrant of right female breast ([place]) HER 2 NEGATIVE  (FISH) Staging form: Breast, AJCC 8th Edition - Pathologic stage from 06/12/2016: Stage IA (pT1c, pN0, cM0, G2, ER: Positive, PR: Negative, HER2: Negative) - Signed by Lloyd Huger, MD on 06/12/2016   Lloyd Huger, MD   12/17/2017 7:09 AM

## 2017-12-11 ENCOUNTER — Ambulatory Visit: Payer: Medicare HMO | Admitting: General Surgery

## 2017-12-13 ENCOUNTER — Encounter: Payer: Self-pay | Admitting: Radiation Oncology

## 2017-12-13 ENCOUNTER — Ambulatory Visit
Admission: RE | Admit: 2017-12-13 | Discharge: 2017-12-13 | Disposition: A | Discharge status: Home / Self Care | Payer: Medicare HMO | Source: Ambulatory Visit | Attending: Radiation Oncology | Admitting: Radiation Oncology

## 2017-12-13 ENCOUNTER — Encounter: Payer: Self-pay | Admitting: General Surgery

## 2017-12-13 ENCOUNTER — Encounter: Payer: Self-pay | Admitting: Oncology

## 2017-12-13 ENCOUNTER — Ambulatory Visit: Payer: Medicare HMO | Admitting: General Surgery

## 2017-12-13 ENCOUNTER — Other Ambulatory Visit: Payer: Self-pay

## 2017-12-13 ENCOUNTER — Inpatient Hospital Stay: Payer: Medicare HMO | Attending: Oncology | Admitting: Oncology

## 2017-12-13 VITALS — BP 130/70 | HR 72 | Resp 12 | Ht 66.0 in | Wt 190.0 lb

## 2017-12-13 VITALS — BP 134/72 | HR 69 | Temp 97.0°F | Resp 12 | Ht 64.0 in | Wt 190.0 lb

## 2017-12-13 DIAGNOSIS — Z923 Personal history of irradiation: Secondary | ICD-10-CM

## 2017-12-13 DIAGNOSIS — M8580 Other specified disorders of bone density and structure, unspecified site: Secondary | ICD-10-CM

## 2017-12-13 DIAGNOSIS — C50411 Malignant neoplasm of upper-outer quadrant of right female breast: Secondary | ICD-10-CM | POA: Diagnosis present

## 2017-12-13 DIAGNOSIS — Z79811 Long term (current) use of aromatase inhibitors: Secondary | ICD-10-CM | POA: Diagnosis not present

## 2017-12-13 DIAGNOSIS — Z170 Estrogen receptor positive status [ER+]: Secondary | ICD-10-CM

## 2017-12-13 DIAGNOSIS — Z9221 Personal history of antineoplastic chemotherapy: Secondary | ICD-10-CM | POA: Insufficient documentation

## 2017-12-13 DIAGNOSIS — C50911 Malignant neoplasm of unspecified site of right female breast: Secondary | ICD-10-CM | POA: Diagnosis not present

## 2017-12-13 DIAGNOSIS — Z853 Personal history of malignant neoplasm of breast: Secondary | ICD-10-CM | POA: Diagnosis not present

## 2017-12-13 NOTE — Patient Instructions (Addendum)
Patient to return in five  months after mammogram order by Dr. Grayland Ormond   The patient is aware to call back for any questions or concerns.The patient is aware to call back for any questions or concerns.

## 2017-12-13 NOTE — Progress Notes (Signed)
Patient here for follow up. No changes since last appt. Breast exam today with Dr. Baruch Gouty.

## 2017-12-13 NOTE — Progress Notes (Signed)
Radiation Oncology Follow up Note  Name: Courtney Herrera   Date:   12/13/2017 MRN:  [id number] DOB: 01-Sep-1940    This 77 y.o. 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 She also had adjuvant chemotherapy. She seen today in routine follow-up and is doing well. She specifically denies breast tenderness cough or bone pain..her last mammogram was back in January again shows changes consistent with lumpectomy and radiation with no evidence of malignancy. She is scheduled for follow-up mammogram in January. She is not on antiestrogen therapy based on the triple negative nature of her disease.  COMPLICATIONS OF TREATMENT: none  FOLLOW UP COMPLIANCE: keeps appointments   PHYSICAL EXAM:  BP (!) (P) 161/85 (BP Location: Left Arm, Patient Position: Sitting)   Pulse (P) 74   Temp (P) 97.9 F (36.6 C) (Tympanic)   Wt (P) 190 lb 7.6 oz (86.4 kg)   BMI (P) 32.70 kg/m  Lungs are clear to A&P cardiac examination essentially unremarkable with regular rate and rhythm. No dominant mass or nodularity is noted in either breast in 2 positions examined. Incision is well-healed. No axillary or supraclavicular adenopathy is appreciated. Cosmetic result is excellent.Well-developed well-nourished patient in NAD. HEENT reveals PERLA, EOMI, discs not visualized.  Oral cavity is clear. No oral mucosal lesions are identified. Neck is clear without evidence of cervical or supraclavicular adenopathy. Lungs are clear to A&P. Cardiac examination is essentially unremarkable with regular rate and rhythm without murmur rub or thrill. Abdomen is benign with no organomegaly or masses noted.  Motor sensory and DTR levels are equal and symmetric in the upper and lower extremities. Cranial nerves II through XII are grossly intact. Proprioception is intact. No peripheral adenopathy or edema is identified. No motor or sensory levels are noted. Crude visual fields are within normal range.  RADIOLOGY RESULTS: mammograms are reviewed and compatible with the above-stated findings  PLAN: present time patient is doing well with no evidence of disease. I'm please were overall progress. I've asked to see her back in 1 year for follow-up. She is a rescheduled for mammograms in January. She is not on antiestrogen therapy. Patient is to call with any concerns.  I would like to take this opportunity to thank you for allowing me to participate in the care of your patient.Noreene Filbert, MD

## 2017-12-13 NOTE — Progress Notes (Signed)
Patient ID: Courtney Herrera, female   DOB: 1940/10/02, 77 y.o.   MRN: [id number]  Chief Complaint  Patient presents with  . Follow-up    HPI Courtney Herrera is a 77 y.o. female here today for her follow up breast cancer check up. Patient states she is doing well.  HPI  Past Medical History:  Diagnosis Date  . Breast cancer ([place]) 05/11/2016   7 mm ER 90%; PR neg, Her 2 neu not overexpressed, Mammoprint: High risk. Whole breast radiation.  . Cancer [facility]) 2012   uterine- radiation  . GERD (gastroesophageal reflux disease)   . Hyperlipidemia   . Personal history of chemotherapy   . Personal history of radiation therapy     Past Surgical History:  Procedure Laterality Date  . ABDOMINAL HYSTERECTOMY  2012  . BREAST BIOPSY Left    neg  . BREAST BIOPSY Right 05/11/2016   INVASIVE MAMMARY CARCINOMA  . BREAST CYST ASPIRATION Left    neg  . BREAST EXCISIONAL BIOPSY Right 06/01/2016   + lumpectomy  . BREAST LUMPECTOMY WITH SENTINEL LYMPH NODE BIOPSY Right 06/01/2016   Procedure: BREAST LUMPECTOMY WITH SENTINEL LYMPH NODE BX;  Surgeon: Robert Bellow, MD;  Location: [facility] ORS;  Service: General;  Laterality: Right;  . COLONOSCOPY  2015    Family History  Problem Relation Age of Onset  . Breast cancer Sister 5  . Cancer Brother        brain  . Cancer Sister   . Non-Hodgkin's lymphoma Brother     Social History Social History   Tobacco Use  . Smoking status: Never Smoker  . Smokeless tobacco: Never Used  Substance Use Topics  . Alcohol use: No    Alcohol/week: 0.0 standard drinks  . Drug use: No    Allergies  Allergen Reactions  . Sulphadimidine [Sulfamethazine] Swelling  . Petrolatum-Zinc Oxide Swelling    Current Outpatient Medications  Medication Sig Dispense Refill  . aspirin 81 MG chewable tablet Chew 81 mg by mouth at bedtime.     Marland Kitchen atorvastatin (LIPITOR) 80 MG tablet TAKE 1 TABLET (80 MG TOTAL) BY MOUTH ONCE DAILY.  1  . carvedilol (COREG) 6.25 MG tablet  Take by mouth.    . furosemide (LASIX) 20 MG tablet Take 20 mg by mouth daily as needed for fluid.     Marland Kitchen letrozole (FEMARA) 2.5 MG tablet TAKE 1 TABLET BY MOUTH EVERY DAY 90 tablet 0  . niacin (NIASPAN) 500 MG CR tablet Take 1 mg by mouth at bedtime.     . pantoprazole (PROTONIX) 40 MG tablet Take 40 mg by mouth every morning.      No current facility-administered medications for this visit.     Review of Systems Review of Systems  Constitutional: Negative.   Respiratory: Negative.   Cardiovascular: Negative.     Blood pressure 130/70, pulse 72, resp. rate 12, height 5\' 6"  (1.676 m), weight 190 lb (86.2 kg).  Physical Exam Physical Exam  Constitutional: She is oriented to person, place, and time. She appears well-developed and well-nourished.  Eyes: Conjunctivae are normal. No scleral icterus.  Neck: Neck supple.  Cardiovascular: Normal rate, regular rhythm and normal heart sounds.  Pulmonary/Chest: Effort normal and breath sounds normal. Right breast exhibits no inverted nipple, no mass, no nipple discharge, no skin change and no tenderness. Left breast exhibits no inverted nipple, no mass, no nipple discharge, no skin change and no tenderness.    Lymphadenopathy:    She has  no cervical adenopathy.    She has no axillary adenopathy.  Neurological: She is alert and oriented to person, place, and time.  Skin: Skin is warm and dry.    Data Reviewed April 26, 2017 mammograms as previously been reviewed.  2020 studies have been ordered by Dr. Grayland Ormond. The patient's January 2019 ultrasound for ongoing nipple drainage showed a small amount of residual seroma which was aspirated.  The patient denies any recurrent drainage or discharge.  Assessment    Been well status post left breast conservation.  Mild thickening likely related to postoperative seroma.    Plan  Patient to return in five months after mammogram order by Dr. Grayland Ormond   The patient is aware to call back for any  questions or concerns.    HPI, Physical Exam, Assessment and Plan have been scribed under the direction and in the presence of Hervey Ard, MD.  Gaspar Cola, CMA  I have completed the exam and reviewed the above documentation for accuracy and completeness.  I agree with the above.  Haematologist has been used and any errors in dictation or transcription are unintentional.  Hervey Ard, M.D., F.A.C.S.  Courtney Herrera 12/14/2017, 6:55 AM

## 2018-01-08 ENCOUNTER — Ambulatory Visit
Admission: RE | Admit: 2018-01-08 | Discharge: 2018-01-08 | Disposition: A | Discharge status: Home / Self Care | Payer: Medicare HMO | Source: Ambulatory Visit | Attending: Oncology | Admitting: Oncology

## 2018-01-08 DIAGNOSIS — Z13820 Encounter for screening for osteoporosis: Secondary | ICD-10-CM | POA: Diagnosis not present

## 2018-01-08 DIAGNOSIS — M8588 Other specified disorders of bone density and structure, other site: Secondary | ICD-10-CM | POA: Diagnosis not present

## 2018-01-08 DIAGNOSIS — C50411 Malignant neoplasm of upper-outer quadrant of right female breast: Secondary | ICD-10-CM | POA: Diagnosis present

## 2018-01-08 DIAGNOSIS — M85851 Other specified disorders of bone density and structure, right thigh: Secondary | ICD-10-CM | POA: Insufficient documentation

## 2018-01-08 DIAGNOSIS — Z79811 Long term (current) use of aromatase inhibitors: Secondary | ICD-10-CM | POA: Insufficient documentation

## 2018-01-27 ENCOUNTER — Other Ambulatory Visit: Payer: Self-pay | Admitting: Oncology

## 2018-04-29 ENCOUNTER — Ambulatory Visit
Admission: RE | Admit: 2018-04-29 | Discharge: 2018-04-29 | Disposition: A | Discharge status: Home / Self Care | Payer: Medicare HMO | Source: Ambulatory Visit | Attending: Oncology | Admitting: Oncology

## 2018-04-29 ENCOUNTER — Telehealth: Payer: Self-pay | Admitting: General Surgery

## 2018-04-29 DIAGNOSIS — C50411 Malignant neoplasm of upper-outer quadrant of right female breast: Secondary | ICD-10-CM | POA: Diagnosis present

## 2018-04-29 NOTE — Telephone Encounter (Signed)
Spoke with patient said she would wait for dr. Bary Castilla to get back or would give [place] a call later on if patient changes her mind.

## 2018-05-07 ENCOUNTER — Ambulatory Visit: Payer: Medicare HMO | Admitting: General Surgery

## 2018-05-16 ENCOUNTER — Other Ambulatory Visit: Payer: Self-pay

## 2018-05-16 ENCOUNTER — Ambulatory Visit: Payer: Medicare HMO | Admitting: General Surgery

## 2018-05-16 ENCOUNTER — Encounter: Payer: Self-pay | Admitting: General Surgery

## 2018-05-16 VITALS — BP 153/84 | HR 76 | Temp 97.5°F | Resp 18 | Ht 66.0 in | Wt 195.8 lb

## 2018-05-16 DIAGNOSIS — Z170 Estrogen receptor positive status [ER+]: Secondary | ICD-10-CM

## 2018-05-16 DIAGNOSIS — C50911 Malignant neoplasm of unspecified site of right female breast: Secondary | ICD-10-CM

## 2018-05-16 NOTE — Progress Notes (Signed)
Patient ID: Courtney Herrera, female   DOB: 12-Sep-1940, 78 y.o.   MRN: [id number]  Chief Complaint  Patient presents with  . Follow-up    5 month f/up mammogram 04-29-18    HPI Courtney Herrera is a 78 y.o. female.  follow up right breast cancer and mammogram completed on 04/29/2018 order by Dr. Grayland Ormond. She admits to a little right breast tenderness after the mammogram.  HPI  Past Medical History:  Diagnosis Date  . Breast cancer ([place]) 05/11/2016   7 mm ER 90%; PR neg, Her 2 neu not overexpressed, Mammoprint: High risk. Whole breast radiation.  . Cancer [facility]) 2012   uterine- radiation  . GERD (gastroesophageal reflux disease)   . Hyperlipidemia   . Personal history of chemotherapy   . Personal history of radiation therapy     Past Surgical History:  Procedure Laterality Date  . ABDOMINAL HYSTERECTOMY  2012  . BREAST BIOPSY Left    neg  . BREAST BIOPSY Right 05/11/2016   INVASIVE MAMMARY CARCINOMA  . BREAST CYST ASPIRATION Left    neg  . BREAST EXCISIONAL BIOPSY Right 06/01/2016   + lumpectomy  . BREAST LUMPECTOMY WITH SENTINEL LYMPH NODE BIOPSY Right 06/01/2016   Procedure: BREAST LUMPECTOMY WITH SENTINEL LYMPH NODE BX;  Surgeon: Robert Bellow, MD;  Location: [facility] ORS;  Service: General;  Laterality: Right;  . COLONOSCOPY  2015    Family History  Problem Relation Age of Onset  . Breast cancer Sister 32  . Cancer Brother        brain  . Cancer Sister   . Non-Hodgkin's lymphoma Brother     Social History Social History   Tobacco Use  . Smoking status: Never Smoker  . Smokeless tobacco: Never Used  Substance Use Topics  . Alcohol use: No    Alcohol/week: 0.0 standard drinks  . Drug use: No    Allergies  Allergen Reactions  . Sulphadimidine [Sulfamethazine] Swelling  . Petrolatum-Zinc Oxide Swelling    Current Outpatient Medications  Medication Sig Dispense Refill  . aspirin 81 MG chewable tablet Chew 81 mg by mouth at bedtime.     Marland Kitchen atorvastatin  (LIPITOR) 80 MG tablet TAKE 1 TABLET (80 MG TOTAL) BY MOUTH ONCE DAILY.  1  . carvedilol (COREG) 6.25 MG tablet Take by mouth.    . furosemide (LASIX) 20 MG tablet Take 20 mg by mouth daily as needed for fluid.     Marland Kitchen letrozole (FEMARA) 2.5 MG tablet TAKE 1 TABLET BY MOUTH EVERY DAY 90 tablet 3  . niacin (NIASPAN) 500 MG CR tablet Take 1 mg by mouth at bedtime.     . pantoprazole (PROTONIX) 40 MG tablet Take 40 mg by mouth every morning.      No current facility-administered medications for this visit.     Review of Systems Review of Systems  Constitutional: Negative.   Respiratory: Negative.   Cardiovascular: Negative.     Blood pressure (!) 153/84, pulse 76, temperature (!) 97.5 F (36.4 C), temperature source Temporal, resp. rate 18, height 5\' 6"  (1.676 m), weight 195 lb 12.8 oz (88.8 kg), SpO2 93 %.  Physical Exam Physical Exam Vitals signs reviewed. Exam conducted with a chaperone present.  Constitutional:      Appearance: Normal appearance.  Neck:     Musculoskeletal: Normal range of motion and neck supple.  Cardiovascular:     Rate and Rhythm: Normal rate and regular rhythm.  Pulmonary:     Effort: Pulmonary  effort is normal.     Breath sounds: Normal breath sounds.  Chest:    Lymphadenopathy:     Cervical: No cervical adenopathy.     Upper Body:     Right upper body: No supraclavicular or axillary adenopathy.     Left upper body: No supraclavicular or axillary adenopathy.  Neurological:     Mental Status: She is alert.     Data Reviewed Bilateral diagnostic mammograms of April 29, 2018 reviewed.  Postsurgical change.  BI-RADS-2.  Assessment    No evidence of recurrent breast cancer.    Plan    The patient will return in 1 year after her mammograms obtained by her medical oncologist have been completed.  She has been encouraged to call if she notices any change or has any new problems in regards to her breast.      HPI, Physical Exam, Assessment and  Plan have been scribed under the direction and in the presence of Robert Bellow, MD. Karie Fetch, RN  I have completed the exam and reviewed the above documentation for accuracy and completeness.  I agree with the above.  Haematologist has been used and any errors in dictation or transcription are unintentional.  Hervey Ard, M.D., F.A.C.S.  Forest Gleason Archer Moist 05/16/2018, 10:25 AM

## 2018-05-16 NOTE — Patient Instructions (Addendum)
The patient is aware to call back for any questions or new concerns.  

## 2018-06-17 NOTE — Progress Notes (Signed)
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 High risk Oncotype with recurrence score of 44.  INTERVAL HISTORY: Patient returns to clinic today for routine 54-monthevaluation.  She currently feels well and is asymptomatic, although does complain of chronic insomnia.  She continues to tolerate letrozole well without significant side effects. She has no neurologic complaints. She denies any recent fevers or illnesses. She has a good appetite and denies weight loss. She has no chest pain or shortness of breath. She denies any nausea, vomiting, constipation, or diarrhea. She has no urinary complaints.  Patient offers no further specific complaints today.  REVIEW OF SYSTEMS:   Review of Systems  Constitutional: Negative.  Negative for fever, malaise/fatigue and weight loss.  Respiratory: Negative.  Negative for cough and shortness of breath.   Cardiovascular: Negative.  Negative for chest pain and leg swelling.  Gastrointestinal: Negative.  Negative for abdominal pain.  Genitourinary: Negative.  Negative for dysuria.  Musculoskeletal: Negative.  Negative for back pain.  Skin: Negative.  Negative for rash.  Neurological: Negative.  Negative for focal weakness, weakness and headaches.  Psychiatric/Behavioral: The patient has insomnia. The patient is not nervous/anxious.     As per HPI. Otherwise, a complete review of systems is negative.  PAST MEDICAL HISTORY: Past Medical  History:  Diagnosis Date  . [place]   7 mm ER 90%; PR neg, Her 2 neu not overexpressed, Mammoprint: High risk. Whole breast radiation.  . Cancer ([facility] 2012   uterine- radiation  . GERD (gastroesophageal reflux disease)   . Hyperlipidemia   . Personal history of chemotherapy   . Personal history of radiation therapy     PAST SURGICAL HISTORY: Past Surgical History:  Procedure Laterality Date  . ABDOMINAL HYSTERECTOMY  2012  . BREAST BIOPSY Left    neg  . BREAST BIOPSY Right 05/11/2016   INVASIVE MAMMARY CARCINOMA  . BREAST CYST ASPIRATION Left    neg  . BREAST EXCISIONAL BIOPSY Right 06/01/2016   + lumpectomy  . BREAST LUMPECTOMY WITH SENTINEL LYMPH NODE BIOPSY Right 06/01/2016   Procedure: BREAST LUMPECTOMY WITH SENTINEL LYMPH NODE BX;  Surgeon: JRobert Bellow MD;  Location: [facility] ORS;  Service: General;  Laterality: Right;  . COLONOSCOPY  2015    FAMILY HISTORY: Family History  Problem Relation Age of Onset  . Breast cancer Sister 667 . Cancer Brother        brain  . Cancer Sister   . Non-Hodgkin's lymphoma Brother     ADVANCED DIRECTIVES (Y/N):  N  HEALTH MAINTENANCE: Social History   Tobacco Use  . Smoking status: Never Smoker  . Smokeless tobacco: Never Used  Substance Use Topics  . Alcohol use: No    Alcohol/week: 0.0 standard drinks  . Drug use: No     Colonoscopy:  PAP:  Bone density:  Lipid panel:  Allergies  Allergen Reactions  . Sulphadimidine [Sulfamethazine] Swelling  . Petrolatum-Zinc Oxide Swelling    Current Outpatient Medications  Medication Sig Dispense Refill  . aspirin 81  MG chewable tablet Chew 81 mg by mouth at bedtime.     Marland Kitchen atorvastatin (LIPITOR) 80 MG tablet TAKE 1 TABLET (80 MG TOTAL) BY MOUTH ONCE DAILY.  1  . carvedilol (COREG) 6.25 MG tablet Take by mouth.    . fenoprofen (NALFON) 600 MG TABS tablet Take 600 mg by mouth 3 (three) times daily.    . furosemide (LASIX) 20 MG tablet Take 20 mg by  mouth daily as needed for fluid.     Marland Kitchen letrozole (FEMARA) 2.5 MG tablet TAKE 1 TABLET BY MOUTH EVERY DAY 90 tablet 3  . niacin (NIASPAN) 500 MG CR tablet Take 1 mg by mouth at bedtime.     . pantoprazole (PROTONIX) 40 MG tablet Take 40 mg by mouth every morning.     Marland Kitchen alendronate (FOSAMAX) 70 MG tablet Take 1 tablet (70 mg total) by mouth once a week. Take with a full glass of water on an empty stomach. 4 tablet 6   No current facility-administered medications for this visit.     OBJECTIVE: Vitals:   06/20/18 1112  BP: (!) 166/85  Pulse: 67  Temp: (!) 97.3 F (36.3 C)     Body mass index is 31.88 kg/m.    ECOG FS:0 - Asymptomatic  General: Well-developed, well-nourished, no acute distress. Eyes: Pink conjunctiva, anicteric sclera. HEENT: Normocephalic, moist mucous membranes. Breast: Patient declined breast exam today. Lungs: Clear to auscultation bilaterally. Heart: Regular rate and rhythm. No rubs, murmurs, or gallops. Abdomen: Soft, nontender, nondistended. No organomegaly noted, normoactive bowel sounds. Musculoskeletal: No edema, cyanosis, or clubbing. Neuro: Alert, answering all questions appropriately. Cranial nerves grossly intact. Skin: No rashes or petechiae noted. Psych: Normal affect.  LAB RESULTS:  Lab Results  Component Value Date   NA 139 10/17/2016   K 4.4 10/17/2016   CL 105 10/17/2016   CO2 27 10/17/2016   GLUCOSE 110 (H) 10/17/2016   BUN 28 (H) 10/17/2016   CREATININE 0.79 10/17/2016   CALCIUM 8.9 10/17/2016   PROT 6.3 (L) 10/17/2016   ALBUMIN 3.7 10/17/2016   AST 14 (L) 10/17/2016   ALT 13 (L) 10/17/2016   ALKPHOS 64 10/17/2016   BILITOT 0.5 10/17/2016   GFRNONAA >60 10/17/2016   GFRAA >60 10/17/2016    Lab Results  Component Value Date   WBC 3.4 (L) 11/09/2016   NEUTROABS 3.1 09/05/2016   HGB 12.1 11/09/2016   HCT 35.7 11/09/2016   MCV 89.3 11/09/2016   PLT 173 11/09/2016     STUDIES: No results found.  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 High risk Oncotype with recurrence score of 44.   PLAN:    1. 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 Her 10 year risk of distant recurrence with tamoxifen alone was approximately 30%, therefore adjuvant chemotherapy using Taxotere and Cytoxan was recommended. Patient completed treatment on Sep 05, 2016.  She has also completed adjuvant XRT.  Continue letrozole for total of 5 years completing August 2023, though given her high risk disease patient may benefit from additional 2 to 5 years of treatment.  Her most recent mammogram on April 29, 2018 was reported as BI-RADS 2.  Return to clinic in 6 months for routine evaluation.   2. Osteopenia:  Patient's most recent bone mineral density on January 08, 2018 reported a T score of -2.4.  This is slightly worse than 1 year prior when her T score was reported at -2.2.  Continue calcium and vitamin D supplementation patient was also given a prescription for Fosamax today.  3.  Insomnia: Chronic and unchanged.  Patient expressed understanding and was in agreement with this plan. She also understands that She can call clinic at any time with any questions, concerns, or complaints.   Cancer Staging Primary cancer of upper outer quadrant of right female breast ([place]) HER 2 NEGATIVE  (FISH) Staging form: Breast, AJCC 8th Edition - Pathologic stage from 06/12/2016: Stage IA (pT1c, pN0, cM0, G2, ER: Positive, PR: Negative, HER2: Negative) - Signed by Lloyd Huger, MD on 06/12/2016   Lloyd Huger, MD   06/22/2018 6:33 AM

## 2018-06-20 ENCOUNTER — Other Ambulatory Visit: Payer: Self-pay

## 2018-06-20 ENCOUNTER — Encounter: Payer: Self-pay | Admitting: Oncology

## 2018-06-20 ENCOUNTER — Inpatient Hospital Stay: Payer: Medicare HMO | Attending: Oncology | Admitting: Oncology

## 2018-06-20 VITALS — BP 166/85 | HR 67 | Temp 97.3°F | Ht 66.0 in | Wt 197.5 lb

## 2018-06-20 DIAGNOSIS — Z923 Personal history of irradiation: Secondary | ICD-10-CM | POA: Diagnosis not present

## 2018-06-20 DIAGNOSIS — Z170 Estrogen receptor positive status [ER+]: Secondary | ICD-10-CM

## 2018-06-20 DIAGNOSIS — G4700 Insomnia, unspecified: Secondary | ICD-10-CM | POA: Diagnosis not present

## 2018-06-20 DIAGNOSIS — Z79811 Long term (current) use of aromatase inhibitors: Secondary | ICD-10-CM | POA: Diagnosis not present

## 2018-06-20 DIAGNOSIS — Z7982 Long term (current) use of aspirin: Secondary | ICD-10-CM | POA: Diagnosis not present

## 2018-06-20 DIAGNOSIS — C50411 Malignant neoplasm of upper-outer quadrant of right female breast: Secondary | ICD-10-CM | POA: Diagnosis present

## 2018-06-20 DIAGNOSIS — Z79899 Other long term (current) drug therapy: Secondary | ICD-10-CM | POA: Insufficient documentation

## 2018-06-20 DIAGNOSIS — M8580 Other specified disorders of bone density and structure, unspecified site: Secondary | ICD-10-CM | POA: Insufficient documentation

## 2018-06-20 DIAGNOSIS — Z9221 Personal history of antineoplastic chemotherapy: Secondary | ICD-10-CM | POA: Insufficient documentation

## 2018-06-20 MED ORDER — ALENDRONATE SODIUM 70 MG PO TABS: 70.0000 mg | ORAL_TABLET | ORAL | 6 refills | Status: DC

## 2018-06-20 NOTE — Progress Notes (Signed)
Patient is here to follow up on her primary cancer of upper outer quadrant of right breast. Patient stated that she has insomnia. Patient would like something to help her sleep. Patient stated that she did not have any pain, nipple discharge, skin discoloration or lump/knots.

## 2018-06-26 ENCOUNTER — Telehealth: Payer: Self-pay | Admitting: *Deleted

## 2018-06-26 NOTE — Telephone Encounter (Signed)
Drug looks to be given for arthritis and pain, Dr. Grayland Ormond only prescribes letrozole and fosamax. She should most likely contact pcp for questions regarding this particular drug.

## 2018-06-26 NOTE — Telephone Encounter (Signed)
Patient states she is not taking and wants it removed form her list

## 2018-06-26 NOTE — Telephone Encounter (Signed)
Patient was looking over her medicine list after leaving and noted that there is a medicine on her list she is not familiar with nor is she taking it. She requests a call to let her know if this is something she is supposed to be taking. Fenoprofen 600 mg is listed as historical provider order. Please return her call [phone]

## 2018-11-02 ENCOUNTER — Encounter: Payer: Self-pay | Admitting: *Deleted

## 2018-11-11 ENCOUNTER — Encounter: Payer: Self-pay | Admitting: General Surgery

## 2018-11-26 ENCOUNTER — Other Ambulatory Visit: Payer: Self-pay | Admitting: Oncology

## 2018-11-27 ENCOUNTER — Other Ambulatory Visit: Payer: Self-pay | Admitting: Oncology

## 2018-12-19 ENCOUNTER — Ambulatory Visit: Payer: Medicare HMO | Admitting: Radiation Oncology

## 2018-12-20 NOTE — Progress Notes (Signed)
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 High risk Oncotype with recurrence score of 44.  INTERVAL HISTORY: Patient returns to clinic today for routine 70-monthevaluation.  She has noticed some increased bone pain, but attributes this to her advanced age and worsening arthritis.  She otherwise feels well and is asymptomatic.  She continues to tolerate letrozole and Fosamax without significant side effects.  She has no neurologic complaints. She denies any recent fevers or illnesses. She has a good appetite and denies weight loss.  She denies any chest pain, shortness of breath, cough, or hemoptysis.  She denies any nausea, vomiting, constipation, or diarrhea. She has no urinary complaints.  Patient offers no further specific complaints today.  REVIEW OF SYSTEMS:   Review of Systems  Constitutional: Negative.  Negative for fever, malaise/fatigue and weight loss.  Respiratory: Negative.  Negative for cough and shortness of breath.   Cardiovascular: Negative.  Negative for chest pain and leg swelling.  Gastrointestinal: Negative.  Negative for abdominal pain.  Genitourinary: Negative.  Negative for dysuria.  Musculoskeletal: Positive for joint pain. Negative for back pain.  Skin: Negative.  Negative for rash.  Neurological: Negative.  Negative for focal weakness, weakness and headaches.  Psychiatric/Behavioral: Negative.  The patient is not nervous/anxious and does not have  insomnia.     As per HPI. Otherwise, a complete review of systems is negative.  PAST MEDICAL HISTORY: Past Medical History:  Diagnosis Date  . [place] 05[place]   7 mm ER 90%; PR neg, Her 2 neu not overexpressed, Mammoprint: High risk. Whole breast radiation.  . Cancer ([facility] 2012   uterine- radiation  . GERD (gastroesophageal reflux disease)   . Hyperlipidemia   . Personal history of chemotherapy   . Personal history of radiation therapy     PAST SURGICAL HISTORY: Past Surgical History:  Procedure Laterality Date  . ABDOMINAL HYSTERECTOMY  2012  . BREAST BIOPSY Left    neg  . BREAST BIOPSY Right 05/11/2016   INVASIVE MAMMARY CARCINOMA  . BREAST CYST ASPIRATION Left    neg  . BREAST EXCISIONAL BIOPSY Right 06/01/2016   + lumpectomy  . BREAST LUMPECTOMY WITH SENTINEL LYMPH NODE BIOPSY Right 06/01/2016   Procedure: BREAST LUMPECTOMY WITH SENTINEL LYMPH NODE BX;  Surgeon: JRobert Bellow MD;  Location: [facility] ORS;  Service: General;  Laterality: Right;  . COLONOSCOPY  2015    FAMILY HISTORY: Family History  Problem Relation Age of Onset  . Breast cancer Sister 618 . Cancer Brother        brain  . Cancer Sister   . Non-Hodgkin's lymphoma Brother     ADVANCED DIRECTIVES (Y/N):  N  HEALTH MAINTENANCE: Social History   Tobacco Use  . Smoking status: Never Smoker  . Smokeless tobacco: Never Used  Substance Use Topics  . Alcohol use: No    Alcohol/week: 0.0 standard drinks  . Drug use: No     Colonoscopy:  PAP:  Bone density:  Lipid panel:  Allergies  Allergen Reactions  .  Sulphadimidine [Sulfamethazine] Swelling  . Petrolatum-Zinc Oxide Swelling    Current Outpatient Medications  Medication Sig Dispense Refill  . alendronate (FOSAMAX) 70 MG tablet TAKE 1 TABLET BY MOUTH ONCE A WEEK. TAKE WITH A FULL GLASS OF WATER ON AN EMPTY STOMACH. 12 tablet 2  . aspirin 81 MG chewable tablet Chew 81 mg by mouth at bedtime.     Marland Kitchen atorvastatin (LIPITOR)  80 MG tablet TAKE 1 TABLET (80 MG TOTAL) BY MOUTH ONCE DAILY.  1  . furosemide (LASIX) 20 MG tablet Take 20 mg by mouth daily as needed for fluid.     Marland Kitchen letrozole (FEMARA) 2.5 MG tablet TAKE 1 TABLET BY MOUTH EVERY DAY 90 tablet 3  . pantoprazole (PROTONIX) 40 MG tablet Take 40 mg by mouth every morning.     . carvedilol (COREG) 6.25 MG tablet Take by mouth.    . niacin (NIASPAN) 500 MG CR tablet Take 1 mg by mouth at bedtime.      No current facility-administered medications for this visit.     OBJECTIVE: Vitals:   12/26/18 1518  BP: (!) 191/84  Pulse: 79     Body mass index is 32.12 kg/m.    ECOG FS:0 - Asymptomatic  General: Well-developed, well-nourished, no acute distress. Eyes: Pink conjunctiva, anicteric sclera. HEENT: Normocephalic, moist mucous membranes. Breast: Exam performed by another provider. Lungs: Clear to auscultation bilaterally. Heart: Regular rate and rhythm. No rubs, murmurs, or gallops. Abdomen: Soft, nontender, nondistended. No organomegaly noted, normoactive bowel sounds. Musculoskeletal: No edema, cyanosis, or clubbing. Neuro: Alert, answering all questions appropriately. Cranial nerves grossly intact. Skin: No rashes or petechiae noted. Psych: Normal affect.  LAB RESULTS:  Lab Results  Component Value Date   NA 139 10/17/2016   K 4.4 10/17/2016   CL 105 10/17/2016   CO2 27 10/17/2016   GLUCOSE 110 (H) 10/17/2016   BUN 28 (H) 10/17/2016   CREATININE 0.79 10/17/2016   CALCIUM 8.9 10/17/2016   PROT 6.3 (L) 10/17/2016   ALBUMIN 3.7 10/17/2016   AST 14 (L) 10/17/2016   ALT 13 (L) 10/17/2016   ALKPHOS 64 10/17/2016   BILITOT 0.5 10/17/2016   GFRNONAA >60 10/17/2016   GFRAA >60 10/17/2016    Lab Results  Component Value Date   WBC 3.4 (L) 11/09/2016   NEUTROABS 3.1 09/05/2016   HGB 12.1 11/09/2016   HCT 35.7 11/09/2016   MCV 89.3 11/09/2016   PLT 173 11/09/2016     STUDIES: No results found.  ASSESSMENT: Pathologic stage Ia ER  positive, PR negative, HER-2 negative invasive carcinoma of the upper outer quadrant of the right breast. High risk Oncotype with recurrence score of 44.   PLAN:    1. 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 Her 10 year risk of distant recurrence with tamoxifen alone was approximately 30%, therefore adjuvant chemotherapy using Taxotere and Cytoxan was recommended. Patient completed treatment on Sep 05, 2016.  She has also completed adjuvant XRT.  Continue letrozole for a minimum of 5 years through August 2023, although given her high risk disease patient will likely benefit from an additional 2 to 5 years of treatment. Her most recent mammogram on April 29, 2018 was reported as BI-RADS 2.  Repeat in January 2021.  Return to clinic in 6 months for routine evaluation.   2.  Osteopenia: Patient's most recent bone mineral density on January 08, 2018 reported a T score of -2.4.  This is slightly worse than 1 year prior when her T score was reported at -2.2.  Repeat bone mineral density in the next 1 to 2 weeks.  Continue Fosamax, calcium, and vitamin D.    I spent a total of 20 minutes face-to-face with the patient of which greater than 50% of the visit was spent in counseling and coordination of care as detailed above.   Patient expressed understanding and was in agreement with this plan. She also understands that She can call clinic at any time with any questions, concerns, or complaints.   Cancer Staging Primary cancer of upper outer quadrant of right female breast ([place]) HER 2 NEGATIVE  (FISH) Staging form: Breast, AJCC 8th Edition - Pathologic stage from 06/12/2016: Stage IA (pT1c, pN0, cM0, G2, ER: Positive, PR: Negative, HER2: Negative) - Signed by Lloyd Huger, MD on 06/12/2016   Lloyd Huger, MD    12/26/2018 7:04 PM

## 2018-12-26 ENCOUNTER — Ambulatory Visit
Admission: RE | Admit: 2018-12-26 | Discharge: 2018-12-26 | Disposition: A | Discharge status: Home / Self Care | Payer: Medicare HMO | Source: Ambulatory Visit | Attending: Radiation Oncology | Admitting: Radiation Oncology

## 2018-12-26 ENCOUNTER — Encounter: Payer: Self-pay | Admitting: Oncology

## 2018-12-26 ENCOUNTER — Other Ambulatory Visit: Payer: Self-pay

## 2018-12-26 ENCOUNTER — Inpatient Hospital Stay: Payer: Medicare HMO | Attending: Oncology | Admitting: Oncology

## 2018-12-26 VITALS — BP 191/84 | HR 79 | Wt 199.0 lb

## 2018-12-26 VITALS — BP 191/84 | HR 79 | Temp 97.9°F | Resp 18 | Wt 199.1 lb

## 2018-12-26 DIAGNOSIS — Z79899 Other long term (current) drug therapy: Secondary | ICD-10-CM | POA: Diagnosis not present

## 2018-12-26 DIAGNOSIS — C50411 Malignant neoplasm of upper-outer quadrant of right female breast: Secondary | ICD-10-CM | POA: Diagnosis not present

## 2018-12-26 DIAGNOSIS — M8580 Other specified disorders of bone density and structure, unspecified site: Secondary | ICD-10-CM | POA: Insufficient documentation

## 2018-12-26 DIAGNOSIS — Z79811 Long term (current) use of aromatase inhibitors: Secondary | ICD-10-CM | POA: Diagnosis not present

## 2018-12-26 DIAGNOSIS — Z170 Estrogen receptor positive status [ER+]: Secondary | ICD-10-CM | POA: Diagnosis not present

## 2018-12-26 NOTE — Progress Notes (Signed)
Patient reports worsening bone pain since starting fosamax.

## 2018-12-26 NOTE — Progress Notes (Signed)
Radiation Oncology Follow up Note  Name: Courtney Herrera   Date:   12/26/2018 MRN:  [id number] DOB: December 27, 1940    This 78 y.o. female presents to the clinic today for 2-year follow-up status post whole breast radiation to her right breast for stage Ia invasive mammary carcinoma triple negative.  REFERRING PROVIDER: Kirk Ruths, MD  HPI: Patient is a 78 year old female now at 2 years having completed whole breast radiation to her right breast for stage I triple negative invasive mammary carcinoma.  Seen today in routine follow-up she is doing well.  She specifically denies breast tenderness cough or bone pain..  Last mammogram was back in January was BI-RADS 2 benign which I have reviewed.  She is currently on letrozole tolerating that well without side effect.  COMPLICATIONS OF TREATMENT: none  FOLLOW UP COMPLIANCE: keeps appointments   PHYSICAL EXAM:  BP (!) 191/84   Pulse 79   Temp 97.9 F (36.6 C)   Resp 18   Wt 199 lb 1.6 oz (90.3 kg)   BMI 32.14 kg/m  Lungs are clear to A&P cardiac examination essentially unremarkable with regular rate and rhythm. No dominant mass or nodularity is noted in either breast in 2 positions examined. Incision is well-healed. No axillary or supraclavicular adenopathy is appreciated. Cosmetic result is excellent.  Well-developed well-nourished patient in NAD. HEENT reveals PERLA, EOMI, discs not visualized.  Oral cavity is clear. No oral mucosal lesions are identified. Neck is clear without evidence of cervical or supraclavicular adenopathy. Lungs are clear to A&P. Cardiac examination is essentially unremarkable with regular rate and rhythm without murmur rub or thrill. Abdomen is benign with no organomegaly or masses noted. Motor sensory and DTR levels are equal and symmetric in the upper and lower extremities. Cranial nerves II through XII are grossly intact. Proprioception is intact. No peripheral adenopathy or edema is identified. No motor or  sensory levels are noted. Crude visual fields are within normal range.  RADIOLOGY RESULTS: Mammograms reviewed compatible with above-stated findings  PLAN: Present time she continues to do well now 2 years out with no evidence of disease.  I am pleased with her overall progress.  She is already scheduled for follow-up mammograms in January.  I have asked to see her back in 1 year for follow-up.  Patient knows to call with any concerns.  I would like to take this opportunity to thank you for allowing me to participate in the care of your patient.Noreene Filbert, MD

## 2019-01-10 ENCOUNTER — Ambulatory Visit
Admission: RE | Admit: 2019-01-10 | Discharge: 2019-01-10 | Disposition: A | Discharge status: Home / Self Care | Payer: Medicare HMO | Source: Ambulatory Visit | Attending: Oncology | Admitting: Oncology

## 2019-01-10 DIAGNOSIS — Z13820 Encounter for screening for osteoporosis: Secondary | ICD-10-CM | POA: Diagnosis not present

## 2019-01-10 DIAGNOSIS — C50411 Malignant neoplasm of upper-outer quadrant of right female breast: Secondary | ICD-10-CM | POA: Insufficient documentation

## 2019-01-10 DIAGNOSIS — Z780 Asymptomatic menopausal state: Secondary | ICD-10-CM | POA: Insufficient documentation

## 2019-03-05 ENCOUNTER — Ambulatory Visit: Admit: 2019-03-05 | Payer: Medicare HMO | Admitting: General Surgery

## 2019-03-05 SURGERY — COLONOSCOPY WITH PROPOFOL
Anesthesia: General

## 2019-05-01 ENCOUNTER — Ambulatory Visit
Admission: RE | Admit: 2019-05-01 | Discharge: 2019-05-01 | Disposition: A | Discharge status: Home / Self Care | Payer: Medicare HMO | Source: Ambulatory Visit | Attending: Oncology | Admitting: Oncology

## 2019-05-01 DIAGNOSIS — C50411 Malignant neoplasm of upper-outer quadrant of right female breast: Secondary | ICD-10-CM

## 2019-05-07 ENCOUNTER — Ambulatory Visit (INDEPENDENT_AMBULATORY_CARE_PROVIDER_SITE_OTHER): Payer: Medicare HMO | Admitting: Surgery

## 2019-05-07 ENCOUNTER — Encounter: Payer: Self-pay | Admitting: Surgery

## 2019-05-07 ENCOUNTER — Other Ambulatory Visit: Payer: Self-pay

## 2019-05-07 VITALS — BP 162/88 | HR 72 | Temp 97.7°F | Resp 14 | Ht 66.0 in | Wt 197.0 lb

## 2019-05-07 DIAGNOSIS — Z170 Estrogen receptor positive status [ER+]: Secondary | ICD-10-CM | POA: Diagnosis not present

## 2019-05-07 DIAGNOSIS — C50911 Malignant neoplasm of unspecified site of right female breast: Secondary | ICD-10-CM | POA: Diagnosis not present

## 2019-05-07 NOTE — Patient Instructions (Addendum)
We will contact you in June 2021 to schedule your mammogram and follow up appointment with Dr.Pabon. If you do not hear from someone from this office by the end of June please call our office and let us know.     Please continue to do breast exams monthly.

## 2019-05-07 NOTE — Progress Notes (Signed)
Outpatient Surgical Follow Up  05/07/2019  Courtney Herrera is an 79 y.o. female.   Chief Complaint  Patient presents with  . Follow-up    follow up Bilateral mammogram    HPI: Courtney Herrera is a 79 year old female with a prior history of breast cancer status post lumpectomy radiation therapy and chemotherapy.  She was operated on 2018.  She has been doing well.  She denies any fevers, no chills no weight loss.  No breast masses or nipple discharge.  She did have a mammogram that I have personally reviewed showing some evidence of benign calcifications on the right side.  No discrete masses that warrant biopsies.  Past Medical History:  Diagnosis Date  . Breast cancer ([place]) 05/11/2016   7 mm ER 90%; PR neg, Her 2 neu not overexpressed, Mammoprint: High risk. Whole breast radiation.  . Cancer [facility]) 2012   uterine- radiation  . GERD (gastroesophageal reflux disease)   . Hyperlipidemia   . Personal history of chemotherapy   . Personal history of radiation therapy     Past Surgical History:  Procedure Laterality Date  . ABDOMINAL HYSTERECTOMY  2012  . BREAST BIOPSY Left    neg  . BREAST BIOPSY Right 05/11/2016   INVASIVE MAMMARY CARCINOMA  . BREAST CYST ASPIRATION Left    neg  . BREAST EXCISIONAL BIOPSY Right 06/01/2016   + lumpectomy  . BREAST LUMPECTOMY WITH SENTINEL LYMPH NODE BIOPSY Right 06/01/2016   Procedure: BREAST LUMPECTOMY WITH SENTINEL LYMPH NODE BX;  Surgeon: Robert Bellow, MD;  Location: [facility] ORS;  Service: General;  Laterality: Right;  . COLONOSCOPY  2015    Family History  Problem Relation Age of Onset  . Breast cancer Sister 69  . Cancer Brother        brain  . Cancer Sister   . Breast cancer Sister 27  . Non-Hodgkin's lymphoma Brother     Social History:  reports that she has never smoked. She has never used smokeless tobacco. She reports that she does not drink alcohol or use drugs.  Allergies:  Allergies  Allergen Reactions  . Sulphadimidine  [Sulfamethazine] Swelling  . Petrolatum-Zinc Oxide Swelling    Medications reviewed.    ROS Full ROS performed and is otherwise negative other than what is stated in HPI   BP (!) 162/88   Pulse 72   Temp 97.7 F (36.5 C) (Temporal)   Resp 14   Ht 5\' 6"  (1.676 m)   Wt 197 lb (89.4 kg)   SpO2 96%   BMI 31.80 kg/m   Physical Exam Vitals and nursing note reviewed. Exam conducted with a chaperone present.  Constitutional:      General: She is not in acute distress.    Appearance: Normal appearance. She is normal weight.  Eyes:     General: No scleral icterus.       Right eye: No discharge.        Left eye: No discharge.     Extraocular Movements: Extraocular movements intact.     Conjunctiva/sclera: Conjunctivae normal.  Cardiovascular:     Rate and Rhythm: Normal rate and regular rhythm.     Heart sounds: No murmur.  Pulmonary:     Effort: Pulmonary effort is normal. No respiratory distress.     Breath sounds: Normal breath sounds. No stridor.     Comments: BREAST: Evidence of right lumpectomy scar with sentinel lymph node biopsy scar.  No evidence of new lesions.  Changes consistent with  radiation the right eye.  No evidence of concerning lesions on either breast.  No evidence of lymphadenopathy. Abdominal:     General: Abdomen is flat. There is no distension.     Palpations: Abdomen is soft. There is no mass.     Tenderness: There is no rebound.     Hernia: No hernia is present.  Musculoskeletal:     Cervical back: Normal range of motion and neck supple.  Skin:    General: Skin is warm and dry.     Capillary Refill: Capillary refill takes less than 2 seconds.  Neurological:     General: No focal deficit present.     Mental Status: She is alert and oriented to person, place, and time.  Psychiatric:        Mood and Affect: Mood normal.        Behavior: Behavior normal.        Thought Content: Thought content normal.        Judgment: Judgment normal.        Assessment/Plan: 79 year old female with history of breast cancer now with some benign calcifications.  We will recommend a 9-month follow-up with a mammogram and also clinical exam.  Currently low suspicious for malignancy.  Had an extensive discussion with patient regarding findings.  No need for immediate surgical intervention at this time    Greater than 50% of the 25 minutes  visit was spent in counseling/coordination of care   Caroleen Hamman, MD [place] [facility]

## 2019-06-04 ENCOUNTER — Other Ambulatory Visit
Admission: RE | Admit: 2019-06-04 | Discharge: 2019-06-04 | Disposition: A | Discharge status: Home / Self Care | Payer: Medicare HMO | Source: Ambulatory Visit | Attending: Internal Medicine | Admitting: Internal Medicine

## 2019-06-04 DIAGNOSIS — Z20822 Contact with and (suspected) exposure to covid-19: Secondary | ICD-10-CM | POA: Diagnosis not present

## 2019-06-04 DIAGNOSIS — Z01812 Encounter for preprocedural laboratory examination: Secondary | ICD-10-CM | POA: Diagnosis present

## 2019-06-05 LAB — SARS CORONAVIRUS 2 (TAT 6-24 HRS): SARS Coronavirus 2: NEGATIVE

## 2019-06-06 ENCOUNTER — Encounter: Payer: Self-pay | Admitting: Internal Medicine

## 2019-06-09 ENCOUNTER — Ambulatory Visit
Admission: RE | Admit: 2019-06-09 | Discharge: 2019-06-09 | Disposition: A | Discharge status: Home / Self Care | Payer: Medicare HMO | Attending: Internal Medicine | Admitting: Internal Medicine

## 2019-06-09 ENCOUNTER — Ambulatory Visit: Payer: Medicare HMO | Admitting: Anesthesiology

## 2019-06-09 ENCOUNTER — Encounter: Payer: Self-pay | Admitting: Internal Medicine

## 2019-06-09 ENCOUNTER — Encounter
Admission: RE | Disposition: A | Discharge status: Home / Self Care | Payer: Self-pay | Source: Home / Self Care | Attending: Internal Medicine

## 2019-06-09 ENCOUNTER — Other Ambulatory Visit: Payer: Self-pay

## 2019-06-09 DIAGNOSIS — Z1211 Encounter for screening for malignant neoplasm of colon: Secondary | ICD-10-CM | POA: Insufficient documentation

## 2019-06-09 DIAGNOSIS — Z9221 Personal history of antineoplastic chemotherapy: Secondary | ICD-10-CM | POA: Diagnosis not present

## 2019-06-09 DIAGNOSIS — Z79811 Long term (current) use of aromatase inhibitors: Secondary | ICD-10-CM | POA: Insufficient documentation

## 2019-06-09 DIAGNOSIS — Z86010 Personal history of colonic polyps: Secondary | ICD-10-CM | POA: Diagnosis not present

## 2019-06-09 DIAGNOSIS — Z79899 Other long term (current) drug therapy: Secondary | ICD-10-CM | POA: Diagnosis not present

## 2019-06-09 DIAGNOSIS — K219 Gastro-esophageal reflux disease without esophagitis: Secondary | ICD-10-CM | POA: Insufficient documentation

## 2019-06-09 DIAGNOSIS — Z7983 Long term (current) use of bisphosphonates: Secondary | ICD-10-CM | POA: Insufficient documentation

## 2019-06-09 DIAGNOSIS — K5730 Diverticulosis of large intestine without perforation or abscess without bleeding: Secondary | ICD-10-CM | POA: Insufficient documentation

## 2019-06-09 DIAGNOSIS — E785 Hyperlipidemia, unspecified: Secondary | ICD-10-CM | POA: Insufficient documentation

## 2019-06-09 DIAGNOSIS — Z8542 Personal history of malignant neoplasm of other parts of uterus: Secondary | ICD-10-CM | POA: Insufficient documentation

## 2019-06-09 DIAGNOSIS — K641 Second degree hemorrhoids: Secondary | ICD-10-CM | POA: Diagnosis not present

## 2019-06-09 DIAGNOSIS — I10 Essential (primary) hypertension: Secondary | ICD-10-CM | POA: Diagnosis not present

## 2019-06-09 DIAGNOSIS — Z923 Personal history of irradiation: Secondary | ICD-10-CM | POA: Diagnosis not present

## 2019-06-09 DIAGNOSIS — Z7982 Long term (current) use of aspirin: Secondary | ICD-10-CM | POA: Insufficient documentation

## 2019-06-09 DIAGNOSIS — C50919 Malignant neoplasm of unspecified site of unspecified female breast: Secondary | ICD-10-CM | POA: Diagnosis not present

## 2019-06-09 DIAGNOSIS — M810 Age-related osteoporosis without current pathological fracture: Secondary | ICD-10-CM | POA: Diagnosis not present

## 2019-06-09 HISTORY — DX: Essential (primary) hypertension: I10

## 2019-06-09 HISTORY — DX: Barrett's esophagus without dysplasia: K22.70

## 2019-06-09 HISTORY — PX: COLONOSCOPY WITH PROPOFOL: SHX5780

## 2019-06-09 HISTORY — DX: Age-related osteoporosis without current pathological fracture: M81.0

## 2019-06-09 SURGERY — COLONOSCOPY WITH PROPOFOL
Anesthesia: General

## 2019-06-09 MED ORDER — SODIUM CHLORIDE 0.9 % IV SOLN: INTRAVENOUS | Status: DC

## 2019-06-09 MED ORDER — PROPOFOL 10 MG/ML IV BOLUS
INTRAVENOUS | Status: DC | PRN
  Administered 2019-06-09: 50 mg via INTRAVENOUS
  Administered 2019-06-09: 20 mg via INTRAVENOUS

## 2019-06-09 MED ORDER — PROPOFOL 500 MG/50ML IV EMUL
INTRAVENOUS | Status: DC | PRN
  Administered 2019-06-09: 150 ug/kg/min via INTRAVENOUS

## 2019-06-09 NOTE — Interval H&P Note (Signed)
History and Physical Interval Note:  06/09/2019 10:31 AM  Courtney Herrera  has presented today for surgery, with the diagnosis of PERSONAL HX.OF COLON POLYPS.  The various methods of treatment have been discussed with the patient and family. After consideration of risks, benefits and other options for treatment, the patient has consented to  Procedure(s): COLONOSCOPY WITH PROPOFOL (N/A) as a surgical intervention.  The patient's history has been reviewed, patient examined, no change in status, stable for surgery.  I have reviewed the patient's chart and labs.  Questions were answered to the patient's satisfaction.     [place], [place]

## 2019-06-09 NOTE — Op Note (Signed)
[facility] Patient Name: Courtney Herrera Procedure Date: 06/09/2019 10:19 AM MRN: [id number] Account #: [id number] Date of Birth: 01/10/1941 Admit Type: Outpatient Age: 79 Room: [facility] ENDO ROOM 3 Gender: Female Note Status: Finalized Procedure:             Colonoscopy Indications:           High risk colon cancer surveillance: Personal history                         of colonic polyps Providers:             Benay Pike. Cordarrel Stiefel MD, MD Medicines:             Propofol per Anesthesia Complications:         No immediate complications. Procedure:             Pre-Anesthesia Assessment:                        - The risks and benefits of the procedure and the                         sedation options and risks were discussed with the                         patient. All questions were answered and informed                         consent was obtained.                        - Patient identification and proposed procedure were                         verified prior to the procedure by the nurse. The                         procedure was verified in the procedure room in the                         endoscopy suite.                        - ASA Grade Assessment: III - A patient with severe                         systemic disease.                        - After reviewing the risks and benefits, the patient                         was deemed in satisfactory condition to undergo the                         procedure.                        After obtaining informed consent, the colonoscope was  passed under direct vision. Throughout the procedure,                         the patient's blood pressure, pulse, and oxygen                         saturations were monitored continuously. The                         Colonoscope was introduced through the anus and                         advanced to the the cecum, identified by appendiceal               orifice and ileocecal valve. The colonoscopy was                         performed without difficulty. The patient tolerated                         the procedure well. The quality of the bowel                         preparation was good. The ileocecal valve, appendiceal                         orifice, and rectum were photographed. Findings:      The perianal exam findings include internal hemorrhoids that prolapse       with straining, but spontaneously regress to the resting position (Grade       II).      The digital rectal exam was normal. Pertinent negatives include normal       sphincter tone.      A few small-mouthed diverticula were found in the sigmoid colon.      Non-bleeding internal hemorrhoids were found during retroflexion. The       hemorrhoids were Grade II (internal hemorrhoids that prolapse but reduce       spontaneously).      The exam was otherwise without abnormality. Impression:            - Internal hemorrhoids that prolapse with straining,                         but spontaneously regress to the resting position                         (Grade II) found on perianal exam.                        - Diverticulosis in the sigmoid colon.                        - Non-bleeding internal hemorrhoids.                        - The examination was otherwise normal.                        - No specimens collected. Recommendation:        - Patient has a  contact number available for                         emergencies. The signs and symptoms of potential                         delayed complications were discussed with the patient.                         Return to normal activities tomorrow. Written                         discharge instructions were provided to the patient.                        - Resume previous diet.                        - Continue present medications.                        - No repeat colonoscopy due to age and the absence of                          advanced adenomas.                        - Return to GI clinic PRN. Procedure Code(s):     --- Professional ---                        [id number], Colorectal cancer screening; colonoscopy on                         individual at high risk Diagnosis Code(s):     --- Professional ---                        K57.30, Diverticulosis of large intestine without                         perforation or abscess without bleeding                        K64.1, Second degree hemorrhoids                        Z86.010, Personal history of colonic polyps CPT copyright 2019 American Medical Association. All rights reserved. The codes documented in this report are preliminary and upon coder review may  be revised to meet current compliance requirements. Efrain Sella MD, MD 06/09/2019 10:50:22 AM This report has been signed electronically. Number of Addenda: 0 Note Initiated On: 06/09/2019 10:19 AM Scope Withdrawal Time: 0 hours 6 minutes 5 seconds  Total Procedure Duration: 0 hours 10 minutes 17 seconds  Estimated Blood Loss:  Estimated blood loss: none.      [facility]

## 2019-06-09 NOTE — Anesthesia Preprocedure Evaluation (Signed)
Anesthesia Evaluation  Patient identified by MRN, date of birth, ID band Patient awake    Reviewed: Allergy & Precautions, NPO status , Patient's Chart, lab work & pertinent test results  History of Anesthesia Complications Negative for: history of anesthetic complications  Airway Mallampati: III  TM Distance: <3 FB     Dental  (+) Chipped, Caps, Dental Advidsory Given   Pulmonary neg pulmonary ROS,    Pulmonary exam normal        Cardiovascular Exercise Tolerance: Good hypertension, (-) angina(-) Past MI and (-) Cardiac Stents Normal cardiovascular exam(-) dysrhythmias (-) Valvular Problems/Murmurs     Neuro/Psych negative neurological ROS  negative psych ROS   GI/Hepatic Neg liver ROS, GERD  Medicated and Controlled,  Endo/Other  negative endocrine ROS  Renal/GU negative Renal ROS  Female GU complaint     Musculoskeletal negative musculoskeletal ROS (+)   Abdominal Normal abdominal exam  (+)   Peds negative pediatric ROS (+)  Hematology negative hematology ROS (+)   Anesthesia Other Findings Past Medical History: 2012: Cancer ([place])     Comment: uterine- radiation No date: GERD (gastroesophageal reflux disease) No date: Hyperlipidemia  Reproductive/Obstetrics                             Anesthesia Physical  Anesthesia Plan  ASA: II  Anesthesia Plan: General   Post-op Pain Management:    Induction: Intravenous  PONV Risk Score and Plan: 3 and Propofol infusion and TIVA  Airway Management Planned: Natural Airway and Nasal Cannula  Additional Equipment:   Intra-op Plan:   Post-operative Plan:   Informed Consent: I have reviewed the patients History and Physical, chart, labs and discussed the procedure including the risks, benefits and alternatives for the proposed anesthesia with the patient or authorized representative who has indicated his/her understanding and  acceptance.     Dental advisory given  Plan Discussed with: CRNA and Surgeon  Anesthesia Plan Comments:         Anesthesia Quick Evaluation

## 2019-06-09 NOTE — H&P (Signed)
Outpatient short stay form Pre-procedure 06/09/2019 10:30 AM Velta Rockholt K. Alice Reichert, M.D.  Primary Physician: Frazier Richards, M.D.  Reason for visit:  Personal hx of colon polyps  History of present illness:                            Patient presents for colonoscopy for a personal hx of colon polyps. The patient denies abdominal pain, abnormal weight loss or rectal bleeding.      Current Facility-Administered Medications:  .  0.9 %  sodium chloride infusion, , Intravenous, Continuous, [place], Benay Pike, MD, Last Rate: 20 mL/hr at 06/09/19 0947, New Bag at 06/09/19 0947  Medications Prior to Admission  Medication Sig Dispense Refill Last Dose  . alendronate (FOSAMAX) 70 MG tablet TAKE 1 TABLET BY MOUTH ONCE A WEEK. TAKE WITH A FULL GLASS OF WATER ON AN EMPTY STOMACH. 12 tablet 2 Past Week at Unknown time  . atorvastatin (LIPITOR) 80 MG tablet TAKE 1 TABLET (80 MG TOTAL) BY MOUTH ONCE DAILY.  1 06/08/2019 at Unknown time  . calcium carbonate (CALTRATE 600) 1500 (600 Ca) MG TABS tablet Take 1,500 mg by mouth 2 (two) times daily with a meal.   06/06/2019  . carvedilol (COREG) 6.25 MG tablet Take 6.25 mg by mouth 2 (two) times daily with a meal.   06/09/2019 at Unknown time  . diclofenac Sodium (VOLTAREN) 1 % GEL Apply 4 g topically 4 (four) times daily.     Marland Kitchen letrozole (FEMARA) 2.5 MG tablet TAKE 1 TABLET BY MOUTH EVERY DAY 90 tablet 3 06/08/2019 at Unknown time  . pantoprazole (PROTONIX) 40 MG tablet Take 40 mg by mouth every morning.    Past Week at Unknown time  . aspirin 81 MG chewable tablet Chew 81 mg by mouth at bedtime.    06/06/2019  . carvedilol (COREG) 6.25 MG tablet Take by mouth.     . furosemide (LASIX) 20 MG tablet Take 20 mg by mouth daily as needed for fluid.      . niacin (NIASPAN) 500 MG CR tablet Take 1 mg by mouth at bedtime.         Allergies  Allergen Reactions  . Sulphadimidine [Sulfamethazine] Swelling  . Petrolatum-Zinc Oxide Swelling     Past Medical History:   Diagnosis Date  . Barrett's esophagus   . Breast cancer ([place]) 05/11/2016   7 mm ER 90%; PR neg, Her 2 neu not overexpressed, Mammoprint: High risk. Whole breast radiation.  . Cancer [facility]) 2012   uterine- radiation  . Endometrial cancer ([place]) 2012  . GERD (gastroesophageal reflux disease)   . Hyperlipidemia   . Hypertension   . Osteoporosis    post-menopausal  . Personal history of chemotherapy   . Personal history of radiation therapy     Review of systems:  Otherwise negative.    Physical Exam  Gen: Alert, oriented. Appears stated age.  HEENT: [place]/AT. PERRLA. Lungs: CTA, no wheezes. CV: RR nl S1, S2. Abd: soft, benign, no masses. BS+ Ext: No edema. Pulses 2+    Planned procedures: Proceed with colonoscopy. The patient understands the nature of the planned procedure, indications, risks, alternatives and potential complications including but not limited to bleeding, infection, perforation, damage to internal organs and possible oversedation/side effects from anesthesia. The patient agrees and gives consent to proceed.  Please refer to procedure notes for findings, recommendations and patient disposition/instructions.     Judah Chevere K. Alice Reichert, M.D. Gastroenterology 06/09/2019  10:30 AM

## 2019-06-09 NOTE — Transfer of Care (Signed)
Immediate Anesthesia Transfer of Care Note  Patient: ZULEIKA CABAZOS  Procedure(s) Performed: COLONOSCOPY WITH PROPOFOL (N/A )  Patient Location: PACU  Anesthesia Type:General  Level of Consciousness: sedated  Airway & Oxygen Therapy: Patient Spontanous Breathing and Patient connected to nasal cannula oxygen  Post-op Assessment: Report given to RN and Post -op Vital signs reviewed and stable  Post vital signs: Reviewed and stable  Last Vitals:  Vitals Value Taken Time  BP    Temp 36.4 C 06/09/19 1051  Pulse 68 06/09/19 1053  Resp 12 06/09/19 1053  SpO2 95 % 06/09/19 1053  Vitals shown include unvalidated device data.  Last Pain:  Vitals:   06/09/19 1051  TempSrc: Temporal  PainSc: Asleep         Complications: No apparent anesthesia complications

## 2019-06-09 NOTE — Anesthesia Postprocedure Evaluation (Signed)
Anesthesia Post Note  Patient: LARYSA MILANI  Procedure(s) Performed: COLONOSCOPY WITH PROPOFOL (N/A )  Patient location during evaluation: Endoscopy Anesthesia Type: General Level of consciousness: awake and alert Pain management: pain level controlled Vital Signs Assessment: post-procedure vital signs reviewed and stable Respiratory status: spontaneous breathing, nonlabored ventilation, respiratory function stable and patient connected to nasal cannula oxygen Cardiovascular status: blood pressure returned to baseline and stable Postop Assessment: no apparent nausea or vomiting Anesthetic complications: no     Last Vitals:  Vitals:   06/09/19 1111 06/09/19 1121  BP: (!) 173/84 (!) 180/88  Pulse:    Resp:    Temp:    SpO2:      Last Pain:  Vitals:   06/09/19 1121  TempSrc:   PainSc: 0-No pain                 Martha Clan

## 2019-06-09 NOTE — Anesthesia Procedure Notes (Signed)
Date/Time: 06/09/2019 10:40 AM Performed by: Nelda Marseille, CRNA Pre-anesthesia Checklist: Patient identified, Emergency Drugs available, Suction available, Patient being monitored and Timeout performed Oxygen Delivery Method: Nasal cannula

## 2019-06-10 ENCOUNTER — Encounter: Payer: Self-pay | Admitting: *Deleted

## 2019-06-26 NOTE — Progress Notes (Signed)
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 High risk Oncotype with recurrence score of 44.  INTERVAL HISTORY: Patient returns to clinic today for routine 64-monthevaluation.  She currently feels well and is asymptomatic.  She is tolerating letrozole and Fosamax without significant side effects. She has no neurologic complaints. She denies any recent fevers or illnesses. She has a good appetite and denies weight loss.  She denies any chest pain, shortness of breath, cough, or hemoptysis.  She denies any nausea, vomiting, constipation, or diarrhea. She has no urinary complaints.  Patient feels at her baseline offers no specific complaints today.  REVIEW OF SYSTEMS:   Review of Systems  Constitutional: Negative.  Negative for fever, malaise/fatigue and weight loss.  Respiratory: Negative.  Negative for cough and shortness of breath.   Cardiovascular: Negative.  Negative for chest pain and leg swelling.  Gastrointestinal: Negative.  Negative for abdominal pain.  Genitourinary: Negative.  Negative for dysuria.  Musculoskeletal: Negative.  Negative for back pain and joint pain.  Skin: Negative.  Negative for rash.  Neurological: Negative.  Negative for focal weakness, weakness and headaches.  Psychiatric/Behavioral: Negative.  The patient is not nervous/anxious and does not have insomnia.     As per HPI. Otherwise, a complete review of systems is negative.  PAST MEDICAL  HISTORY: Past Medical History:  Diagnosis Date  . Barrett's esophagus   . [place]   7 mm ER 90%; PR neg, Her 2 neu not overexpressed, Mammoprint: High risk. Whole breast radiation.  . Cancer ([facility] 2012   uterine- radiation  . [place]  . GERD (gastroesophageal reflux disease)   . Hyperlipidemia   . Hypertension   . Osteoporosis    post-menopausal  . Personal history of chemotherapy   . Personal history of radiation therapy     PAST SURGICAL HISTORY: Past Surgical History:  Procedure Laterality Date  . ABDOMINAL HYSTERECTOMY  2012  . BREAST BIOPSY Left    neg  . BREAST BIOPSY Right 05/11/2016   INVASIVE MAMMARY CARCINOMA  . BREAST CYST ASPIRATION Left    neg  . BREAST EXCISIONAL BIOPSY Right 06/01/2016   + lumpectomy  . BREAST LUMPECTOMY WITH SENTINEL LYMPH NODE BIOPSY Right 06/01/2016   Procedure: BREAST LUMPECTOMY WITH SENTINEL LYMPH NODE BX;  Surgeon: JRobert Bellow MD;  Location: [facility] ORS;  Service: General;  Laterality: Right;  . COLONOSCOPY  2015  . COLONOSCOPY WITH PROPOFOL N/A 06/09/2019   Procedure: COLONOSCOPY WITH PROPOFOL;  Surgeon: Toledo, TBenay Pike MD;  Location: [facility] ENDOSCOPY;  Service: Gastroenterology;  Laterality: N/A;    FAMILY HISTORY: Family History  Problem Relation Age of Onset  . Breast cancer Sister 633 . Cancer Brother        brain  . Cancer Sister   . Breast cancer Sister 838 . Non-Hodgkin's lymphoma Brother     ADVANCED DIRECTIVES (Y/N):  N  HEALTH MAINTENANCE: Social History   Tobacco Use  . Smoking status: Never Smoker  . Smokeless tobacco: Never Used  Substance Use Topics  . Alcohol use: No    Alcohol/week: 0.0 standard drinks  . Drug use: No     Colonoscopy:  PAP:  Bone density:  Lipid panel:  Allergies  Allergen Reactions  . Sulphadimidine [Sulfamethazine] Swelling  . Petrolatum-Zinc Oxide Swelling    Current Outpatient Medications  Medication Sig Dispense Refill    . alendronate (FOSAMAX) 70 MG tablet TAKE 1 TABLET BY MOUTH ONCE A WEEK. TAKE WITH A FULL GLASS OF WATER ON AN EMPTY STOMACH. 12 tablet 2  . aspirin 81 MG chewable tablet Chew 81 mg by mouth at bedtime.     Marland Kitchen atorvastatin (LIPITOR) 80 MG tablet TAKE 1 TABLET (80 MG TOTAL) BY MOUTH ONCE DAILY.  1  . calcium carbonate (CALTRATE 600) 1500 (600 Ca) MG TABS tablet Take 1,500 mg by mouth 2 (two) times daily with a meal.    . carvedilol (COREG) 6.25 MG tablet Take 6.25 mg by mouth 2 (two) times daily with a meal.    . diclofenac Sodium (VOLTAREN) 1 % GEL Apply 4 g topically 4 (four) times daily.    . furosemide (LASIX) 20 MG tablet Take 20 mg by mouth daily as needed for fluid.     Marland Kitchen letrozole (FEMARA) 2.5 MG tablet TAKE 1 TABLET BY MOUTH EVERY DAY 90 tablet 3  . niacin (NIASPAN) 500 MG CR tablet Take 1 mg by mouth at bedtime.     . pantoprazole (PROTONIX) 40 MG tablet Take 40 mg by mouth as needed.      No current facility-administered medications for this visit.    OBJECTIVE: Vitals:   06/30/19 1120  BP: (!) 165/83  Pulse: 70  Temp: (!) 97.4 F (36.3 C)     Body mass index is 32.41 kg/m.    ECOG FS:0 - Asymptomatic  General: Well-developed, well-nourished, no acute distress. Eyes: Pink conjunctiva, anicteric sclera. HEENT: Normocephalic, moist mucous membranes. Breast: Exam deferred today. Lungs: No audible wheezing or coughing. Heart: Regular rate and rhythm. Abdomen: Soft, nontender, no obvious distention. Musculoskeletal: No edema, cyanosis, or clubbing. Neuro: Alert, answering all questions appropriately. Cranial nerves grossly intact. Skin: No rashes or petechiae noted. Psych: Normal affect.  LAB RESULTS:  Lab Results  Component Value Date   NA 139 10/17/2016   K 4.4 10/17/2016   CL 105 10/17/2016   CO2 27 10/17/2016   GLUCOSE 110 (H) 10/17/2016   BUN 28 (H) 10/17/2016   CREATININE 0.79 10/17/2016   CALCIUM 8.9 10/17/2016   PROT 6.3 (L) 10/17/2016   ALBUMIN 3.7  10/17/2016   AST 14 (L) 10/17/2016   ALT 13 (L) 10/17/2016   ALKPHOS 64 10/17/2016   BILITOT 0.5 10/17/2016   GFRNONAA >60 10/17/2016   GFRAA >60 10/17/2016    Lab Results  Component Value Date   WBC 3.4 (L) 11/09/2016   NEUTROABS 3.1 09/05/2016   HGB 12.1 11/09/2016   HCT 35.7 11/09/2016   MCV 89.3 11/09/2016   PLT 173 11/09/2016     STUDIES: No results found.  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 High risk Oncotype with recurrence score of 44.   PLAN:    1. 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 Her 10 year risk of distant recurrence with  tamoxifen alone was approximately 30%, therefore adjuvant chemotherapy using Taxotere and Cytoxan was recommended. Patient completed treatment on Sep 05, 2016.  She also completed adjuvant XRT.  Continue letrozole for a minimum of 5 years through August 2023.  Although given her high risk disease, will likely recommend an additional 2 to 5 years of treatment.  Her most recent mammogram on May 01, 2019 was reported as BI-RADS 3 and recommendation was to repeat right diagnostic mammogram in 6 months in July 2021.  Return to clinic in 6 months for routine evaluation.   2. Osteopenia: Patient's most recent bone mineral density on January 10, 2019 reported T score of -1.9 which is significantly improved over 1 year prior where the T score was reported -2.4.  Patient has been instructed to complete her current refill of Fosamax for the next 3 months and then discontinue treatment.  Repeat bone mineral density in September 2021.  Patient expressed understanding and was in agreement with this plan. She also understands that She can call clinic at any time with any  questions, concerns, or complaints.   Cancer Staging Primary cancer of upper outer quadrant of right female breast ([place]) HER 2 NEGATIVE  (FISH) Staging form: Breast, AJCC 8th Edition - Pathologic stage from 06/12/2016: Stage IA (pT1c, pN0, cM0, G2, ER: Positive, PR: Negative, HER2: Negative) - Signed by Lloyd Huger, MD on 06/12/2016   Lloyd Huger, MD   06/30/2019 12:50 PM

## 2019-06-30 ENCOUNTER — Inpatient Hospital Stay: Payer: Medicare HMO | Attending: Oncology | Admitting: Oncology

## 2019-06-30 ENCOUNTER — Encounter: Payer: Self-pay | Admitting: Oncology

## 2019-06-30 VITALS — BP 165/83 | HR 70 | Temp 97.4°F | Wt 200.8 lb

## 2019-06-30 DIAGNOSIS — C50411 Malignant neoplasm of upper-outer quadrant of right female breast: Secondary | ICD-10-CM | POA: Insufficient documentation

## 2019-06-30 DIAGNOSIS — Z79899 Other long term (current) drug therapy: Secondary | ICD-10-CM | POA: Insufficient documentation

## 2019-06-30 DIAGNOSIS — I10 Essential (primary) hypertension: Secondary | ICD-10-CM | POA: Diagnosis not present

## 2019-06-30 DIAGNOSIS — Z170 Estrogen receptor positive status [ER+]: Secondary | ICD-10-CM | POA: Diagnosis not present

## 2019-06-30 DIAGNOSIS — Z79811 Long term (current) use of aromatase inhibitors: Secondary | ICD-10-CM | POA: Insufficient documentation

## 2019-07-30 ENCOUNTER — Other Ambulatory Visit: Payer: Self-pay | Admitting: Oncology

## 2019-09-22 DIAGNOSIS — M1712 Unilateral primary osteoarthritis, left knee: Secondary | ICD-10-CM | POA: Insufficient documentation

## 2019-10-30 ENCOUNTER — Ambulatory Visit
Admission: RE | Admit: 2019-10-30 | Discharge: 2019-10-30 | Disposition: A | Discharge status: Home / Self Care | Payer: Medicare HMO | Source: Ambulatory Visit | Attending: Oncology | Admitting: Oncology

## 2019-10-30 DIAGNOSIS — C50411 Malignant neoplasm of upper-outer quadrant of right female breast: Secondary | ICD-10-CM | POA: Insufficient documentation

## 2019-11-10 ENCOUNTER — Other Ambulatory Visit: Payer: Self-pay

## 2019-11-10 ENCOUNTER — Encounter: Payer: Self-pay | Admitting: Surgery

## 2019-11-10 ENCOUNTER — Ambulatory Visit (INDEPENDENT_AMBULATORY_CARE_PROVIDER_SITE_OTHER): Payer: Medicare HMO | Admitting: Surgery

## 2019-11-10 VITALS — BP 157/84 | HR 82 | Temp 99.1°F | Resp 12 | Wt 195.2 lb

## 2019-11-10 DIAGNOSIS — C50911 Malignant neoplasm of unspecified site of right female breast: Secondary | ICD-10-CM | POA: Diagnosis not present

## 2019-11-10 DIAGNOSIS — Z170 Estrogen receptor positive status [ER+]: Secondary | ICD-10-CM

## 2019-11-10 MED ORDER — LIDOCAINE 5 % EX OINT: 1.0000 "application " | TOPICAL_OINTMENT | CUTANEOUS | 0 refills | Status: DC | PRN

## 2019-11-10 NOTE — Progress Notes (Signed)
Outpatient Surgical Follow Up  11/10/2019  Courtney Herrera is an 79 y.o. female.   Chief Complaint  Patient presents with  . Follow-up    Earl[facility] 10/30/19    HPI:  Courtney Herrera is a 79 year old female with a prior history of breast cancer status post lumpectomy radiation therapy and chemotherapy.  She was operated on 2018.  She has been doing well.  She denies any fevers, no chills no weight loss.  No breast masses or nipple discharge.  She did have a mammogram that I have personally reviewed showing some evidence of benign calcifications on the right side. No discrete masses that warrant biopsies.  Please note that this calcifications have remained stable without any major changes. He does have some discomfort around the incision on both breast and the axilla on the right side.  Past Medical History:  Diagnosis Date  . Arthritis   . Barrett's esophagus   . Breast cancer ([place]) 05/11/2016   7 mm ER 90%; PR neg, Her 2 neu not overexpressed, Mammoprint: High risk. Whole breast radiation.  . Cancer [facility]) 2012   uterine- radiation  . Endometrial cancer ([place]) 2012  . GERD (gastroesophageal reflux disease)   . Hyperlipidemia   . Hypertension   . Osteoporosis    post-menopausal  . Personal history of chemotherapy   . Personal history of radiation therapy 2018    Past Surgical History:  Procedure Laterality Date  . ABDOMINAL HYSTERECTOMY  2012  . BREAST BIOPSY Left    neg  . BREAST BIOPSY Right 05/11/2016   INVASIVE MAMMARY CARCINOMA  . BREAST CYST ASPIRATION Left    neg  . BREAST EXCISIONAL BIOPSY Right 06/01/2016   + lumpectomy  . BREAST LUMPECTOMY Right 2018   Invasive mammary, neg margins  . BREAST LUMPECTOMY WITH SENTINEL LYMPH NODE BIOPSY Right 06/01/2016   Procedure: BREAST LUMPECTOMY WITH SENTINEL LYMPH NODE BX;  Surgeon: Robert Bellow, MD;  Location: [facility] ORS;  Service: General;  Laterality: Right;  . COLONOSCOPY  2015  . COLONOSCOPY WITH PROPOFOL N/A  06/09/2019   Procedure: COLONOSCOPY WITH PROPOFOL;  Surgeon: Toledo, Benay Pike, MD;  Location: [facility] ENDOSCOPY;  Service: Gastroenterology;  Laterality: N/A;    Family History  Problem Relation Age of Onset  . Breast cancer Sister 39  . Cancer Brother        brain  . Cancer Sister   . Breast cancer Sister 67  . Non-Hodgkin's lymphoma Brother     Social History:  reports that she has never smoked. She has never used smokeless tobacco. She reports that she does not drink alcohol and does not use drugs.  Allergies:  Allergies  Allergen Reactions  . Sulphadimidine [Sulfamethazine] Swelling  . Petrolatum-Zinc Oxide Swelling    Medications reviewed.    ROS Full ROS performed and is otherwise negative other than what is stated in HPI   BP (!) 157/84   Pulse 82   Temp 99.1 F (37.3 C)   Resp 12   Wt 195 lb 3.2 oz (88.5 kg)   SpO2 97%   BMI 31.51 kg/m   Physical Exam Physical Exam Vitals and nursing note reviewed. Exam conducted with a chaperone present.  Constitutional:      General: She is not in acute distress.    Appearance: Normal appearance. She is normal weight.  Eyes:     General: No scleral icterus.       Right eye: No discharge.  Left eye: No discharge.     Extraocular Movements: Extraocular movements intact.     Conjunctiva/sclera: Conjunctivae normal.  Cardiovascular:     Rate and Rhythm: Normal rate and regular rhythm.     Heart sounds: No murmur.  Pulmonary:     Effort: Pulmonary effort is normal. No respiratory distress.     Breath sounds: Normal breath sounds. No stridor.     Comments: BREAST: Evidence of right lumpectomy scar with sentinel lymph node biopsy scar.  No evidence of new lesions.  Changes consistent with radiation the right breast.  No evidence of concerning lesions on either breast.  No evidence of lymphadenopathy. There is also evidence of mild tenderness to palpation of both the axilla and the breast scars Abdominal:      General: Abdomen is flat. There is no distension.     Palpations: Abdomen is soft. There is no mass.     Tenderness: There is no rebound.     Hernia: No hernia is present.  Musculoskeletal:     Cervical back: Normal range of motion and neck supple.  Skin:    General: Skin is warm and dry.     Capillary Refill: Capillary refill takes less than 2 seconds.  Neurological:     General: No focal deficit present.     Mental Status: She is alert and oriented to person, place, and time.  Psychiatric:        Mood and Affect: Mood normal.        Behavior: Behavior normal.        Thought Content: Thought content normal.        Judgment: Judgment nml   Assessment/Plan: 79 year old female with a history of right breast cancer status post lumpectomy and radiation therapy.  There is evidence of persistent calcifications that this warrants close follow-up.  We will see her back in 6 months with a mammogram.  There is no need for biopsies at this time or any surgical interventions.  I did offer lidocaine cream for the discomfort and she wants to give it a try. Greater than 50% of the 25 minutes  visit was spent in counseling/coordination of care   Caroleen Hamman, MD [place] Surgeon

## 2019-11-10 NOTE — Patient Instructions (Addendum)
Pick up your prescription at your local pharmacy.   Our office will contact you around December 2021 to schedule your mammogram and office visit for January 2022.  If Dr Grayland Ormond schedules your mammogram we will still contact you to schedule your office visit.  Call the office if you have any questions or concerns.  Breast Self-Awareness Breast self-awareness means being familiar with how your breasts look and feel. It involves checking your breasts regularly and reporting any changes to your health care provider. Practicing breast self-awareness is important. Sometimes changes may not be harmful (are benign), but sometimes a change in your breasts can be a sign of a serious medical problem. It is important to learn how to do this procedure correctly so that you can catch problems early, when treatment is more likely to be successful. All women should practice breast self-awareness, including women who have had breast implants. What you need:  A mirror.  A well-lit room. How to do a breast self-exam A breast self-exam is one way to learn what is normal for your breasts and whether your breasts are changing. To do a breast self-exam: Look for changes  1. Remove all the clothing above your waist. 2. Stand in front of a mirror in a room with good lighting. 3. Put your hands on your hips. 4. Push your hands firmly downward. 5. Compare your breasts in the mirror. Look for differences between them (asymmetry), such as: ? Differences in shape. ? Differences in size. ? Puckers, dips, and bumps in one breast and not the other. 6. Look at each breast for changes in the skin, such as: ? Redness. ? Scaly areas. 7. Look for changes in your nipples, such as: ? Discharge. ? Bleeding. ? Dimpling. ? Redness. ? A change in position. Feel for changes Carefully feel your breasts for lumps and changes. It is best to do this while lying on your back on the floor, and again while sitting or standing in  the tub or shower with soapy water on your skin. Feel each breast in the following way: 1. Place the arm on the side of the breast you are examining above your head. 2. Feel your breast with the other hand. 3. Start in the nipple area and make -inch (2 cm) overlapping circles to feel your breast. Use the pads of your three middle fingers to do this. Apply light pressure, then medium pressure, then firm pressure. The light pressure will allow you to feel the tissue closest to the skin. The medium pressure will allow you to feel the tissue that is a little deeper. The firm pressure will allow you to feel the tissue close to the ribs. 4. Continue the overlapping circles, moving downward over the breast until you feel your ribs below your breast. 5. Move one finger-width toward the center of the body. Continue to use the -inch (2 cm) overlapping circles to feel your breast as you move slowly up toward your collarbone. 6. Continue the up-and-down exam using all three pressures until you reach your armpit.  Write down what you find Writing down what you find can help you remember what to discuss with your health care provider. Write down:  What is normal for each breast.  Any changes that you find in each breast, including: ? The kind of changes you find. ? Any pain or tenderness. ? Size and location of any lumps.  Where you are in your menstrual cycle, if you are still menstruating. General tips  and recommendations  Examine your breasts every month.  If you are breastfeeding, the best time to examine your breasts is after a feeding or after using a breast pump.  If you menstruate, the best time to examine your breasts is 5-7 days after your period. Breasts are generally lumpier during menstrual periods, and it may be more difficult to notice changes.  With time and practice, you will become more familiar with the variations in your breasts and more comfortable with the exam. Contact a health  care provider if you:  See a change in the shape or size of your breasts or nipples.  See a change in the skin of your breast or nipples, such as a reddened or scaly area.  Have unusual discharge from your nipples.  Find a lump or thick area that was not there before.  Have pain in your breasts.  Have any concerns related to your breast health. Summary  Breast self-awareness includes looking for physical changes in your breasts, as well as feeling for any changes within your breasts.  Breast self-awareness should be performed in front of a mirror in a well-lit room.  You should examine your breasts every month. If you menstruate, the best time to examine your breasts is 5-7 days after your menstrual period.  Let your health care provider know of any changes you notice in your breasts, including changes in size, changes on the skin, pain or tenderness, or unusual fluid from your nipples. This information is not intended to replace advice given to you by your health care provider. Make sure you discuss any questions you have with your health care provider. Document Revised: 11/20/2017 Document Reviewed: 11/20/2017 Elsevier Patient Education  [place].

## 2019-12-29 ENCOUNTER — Other Ambulatory Visit: Payer: Self-pay | Admitting: Oncology

## 2020-01-07 ENCOUNTER — Encounter: Payer: Self-pay | Admitting: Radiation Oncology

## 2020-01-07 ENCOUNTER — Other Ambulatory Visit: Payer: Self-pay

## 2020-01-07 ENCOUNTER — Ambulatory Visit
Admission: RE | Admit: 2020-01-07 | Discharge: 2020-01-07 | Disposition: A | Discharge status: Home / Self Care | Payer: Medicare HMO | Source: Ambulatory Visit | Attending: Radiation Oncology | Admitting: Radiation Oncology

## 2020-01-07 VITALS — BP 152/76 | HR 81 | Temp 97.8°F | Wt 197.0 lb

## 2020-01-07 DIAGNOSIS — Z853 Personal history of malignant neoplasm of breast: Secondary | ICD-10-CM | POA: Insufficient documentation

## 2020-01-07 DIAGNOSIS — Z923 Personal history of irradiation: Secondary | ICD-10-CM | POA: Insufficient documentation

## 2020-01-07 DIAGNOSIS — C50411 Malignant neoplasm of upper-outer quadrant of right female breast: Secondary | ICD-10-CM

## 2020-01-07 NOTE — Progress Notes (Signed)
Radiation Oncology Follow up Note  Name: Courtney Herrera   Date:   01/07/2020 MRN:  [id number] DOB: [birth year]    This 79 y.o. female presents to the clinic today for 3-year follow-up status post whole breast radiation to right breast for stage Ia invasive mammary carcinoma triple negative.  REFERRING PROVIDER: Kirk Ruths, MD  HPI: Patient is a 79 year old female now out 3 years having completed whole breast radiation to her right breast for stage Ia invasive mammary carcinoma triple negative.  Seen today in routine follow-up she is doing well specifically denies cough or bone pain she does have continued some tenderness of her right breast mostly associated with doing garden work or exertion..  She has been having mammograms last one was back in July which was BI-RADS 2 benign.  She has been having mammograms every 6 months although she is set to resume once yearly mammograms.  She is currently on Femara tolerating that well.  COMPLICATIONS OF TREATMENT: none  FOLLOW UP COMPLIANCE: keeps appointments   PHYSICAL EXAM:  BP (!) 152/76 (BP Location: Left Arm, Patient Position: Sitting, Cuff Size: Large)   Pulse 81   Temp 97.8 F (36.6 C) (Tympanic)   Wt 197 lb (89.4 kg)   BMI 31.80 kg/m  Lungs are clear to A&P cardiac examination essentially unremarkable with regular rate and rhythm. No dominant mass or nodularity is noted in either breast in 2 positions examined. Incision is well-healed. No axillary or supraclavicular adenopathy is appreciated. Cosmetic result is excellent.  Well-developed well-nourished patient in NAD. HEENT reveals PERLA, EOMI, discs not visualized.  Oral cavity is clear. No oral mucosal lesions are identified. Neck is clear without evidence of cervical or supraclavicular adenopathy. Lungs are clear to A&P. Cardiac examination is essentially unremarkable with regular rate and rhythm without murmur rub or thrill. Abdomen is benign with no organomegaly or masses noted.  Motor sensory and DTR levels are equal and symmetric in the upper and lower extremities. Cranial nerves II through XII are grossly intact. Proprioception is intact. No peripheral adenopathy or edema is identified. No motor or sensory levels are noted. Crude visual fields are within normal range.  RADIOLOGY RESULTS: Mammograms reviewed compatible with above-stated findings  PLAN: Present time patient is doing well 3 years out with no evidence of disease.  We discussed today her family history which is significant for cancer and I suggested possibility of family members or herself being genetically tested she will consider that.  Otherwise have asked to see her back in 1 year and then will discontinue follow-up care.  Patient knows to call with any concerns.  I would like to take this opportunity to thank you for allowing me to participate in the care of your patient.Noreene Filbert, MD

## 2020-01-09 NOTE — Progress Notes (Signed)
[facility]  [phone]) [phone] [phone]  ID: Courtney Herrera OB: [birth year]  MR#: [id number]  NIV#:[id number]  Patient Care Team: Lauro Regulus, MD as PCP - General (Internal Medicine) Jim Like, RN as Oncology Nurse Navigator Byrnett, Merrily Pew, MD (General Surgery) Lauro Regulus, MD (Internal Medicine) Jeralyn Ruths, MD as Consulting Physician (Oncology)  I connected with Courtney Herrera on 01/15/20 at 10:30 AM EDT by video enabled telemedicine visit and verified that I am speaking with the correct person using two identifiers.   I discussed the limitations, risks, security and privacy concerns of performing an evaluation and management service by telemedicine and the availability of in-person appointments. I also discussed with the patient that there may be a patient responsible charge related to this service. The patient expressed understanding and agreed to proceed.   Other persons participating in the visit and their role in the encounter: Patient, MD.  Patient's location: Home. Provider's location: Clinic.  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 High risk Oncotype with recurrence score of 44.  INTERVAL HISTORY: Patient agreed to video assisted telemedicine visit for her routine 105-month evaluation.  She continues to feel well and remains asymptomatic.  She is tolerating letrozole without significant side effects.  She has no neurologic complaints. She denies any recent fevers or illnesses. She has a good appetite and denies weight loss.  She denies any chest pain, shortness of breath, cough, or hemoptysis.  She denies any nausea, vomiting, constipation, or diarrhea. She has no urinary complaints.  Patient offers no specific complaints today.  REVIEW OF SYSTEMS:   Review of Systems  Constitutional: Negative.  Negative  for fever, malaise/fatigue and weight loss.  Respiratory: Negative.  Negative for cough and shortness of breath.   Cardiovascular: Negative.  Negative for chest pain and leg swelling.  Gastrointestinal: Negative.  Negative for abdominal pain.  Genitourinary: Negative.  Negative for dysuria.  Musculoskeletal: Negative.  Negative for back pain and joint pain.  Skin: Negative.  Negative for rash.  Neurological: Negative.  Negative for focal weakness, weakness and headaches.  Psychiatric/Behavioral: Negative.  The patient is not nervous/anxious and does not have insomnia.     As per HPI. Otherwise, a complete review of systems is negative.  PAST MEDICAL HISTORY: Past Medical History:  Diagnosis Date  . Arthritis   . Barrett's esophagus   . Breast cancer (HCC) 05/11/2016   7 mm ER 90%; PR neg, Her 2 neu not overexpressed, Mammoprint: High risk. Whole breast radiation.  . Cancer [facility] 2012   uterine- radiation  . Endometrial cancer (HCC) 2012  . GERD (gastroesophageal reflux disease)   . Hyperlipidemia   . Hypertension   . Osteoporosis    post-menopausal  . Personal history of chemotherapy   . Personal history of radiation therapy 2018    PAST SURGICAL HISTORY: Past Surgical History:  Procedure Laterality Date  . ABDOMINAL HYSTERECTOMY  2012  . BREAST BIOPSY Left    neg  . BREAST BIOPSY Right 05/11/2016   INVASIVE MAMMARY CARCINOMA  . BREAST CYST ASPIRATION Left    neg  . BREAST EXCISIONAL BIOPSY Right 06/01/2016   + lumpectomy  . BREAST LUMPECTOMY Right 2018   Invasive mammary, neg margins  . BREAST LUMPECTOMY WITH SENTINEL LYMPH NODE BIOPSY Right 06/01/2016   Procedure: BREAST LUMPECTOMY WITH SENTINEL LYMPH NODE BX;  Surgeon: Earline Mayotte, MD;  Location: [facility] ORS;  Service: General;  Laterality: Right;  . COLONOSCOPY  2015  . COLONOSCOPY WITH PROPOFOL N/A 06/09/2019   Procedure: COLONOSCOPY WITH PROPOFOL;  Surgeon: Toledo, Benay Pike, MD;  Location: [facility] ENDOSCOPY;   Service: Gastroenterology;  Laterality: N/A;    FAMILY HISTORY: Family History  Problem Relation Age of Onset  . Breast cancer Sister 89  . Cancer Brother        brain  . Cancer Sister   . Breast cancer Sister 39  . Non-Hodgkin's lymphoma Brother     ADVANCED DIRECTIVES (Y/N):  N  HEALTH MAINTENANCE: Social History   Tobacco Use  . Smoking status: Never Smoker  . Smokeless tobacco: Never Used  Vaping Use  . Vaping Use: Never used  Substance Use Topics  . Alcohol use: No    Alcohol/week: 0.0 standard drinks  . Drug use: No     Colonoscopy:  PAP:  Bone density:  Lipid panel:  Allergies  Allergen Reactions  . Sulphadimidine [Sulfamethazine] Swelling  . Petrolatum-Zinc Oxide Swelling    Current Outpatient Medications  Medication Sig Dispense Refill  . aspirin 81 MG chewable tablet Chew 81 mg by mouth at bedtime.     Marland Kitchen atorvastatin (LIPITOR) 80 MG tablet TAKE 1 TABLET (80 MG TOTAL) BY MOUTH ONCE DAILY.  1  . calcium carbonate (CALTRATE 600) 1500 (600 Ca) MG TABS tablet Take 1,500 mg by mouth 2 (two) times daily with a meal.    . carvedilol (COREG) 6.25 MG tablet Take 6.25 mg by mouth 2 (two) times daily with a meal.    . cyclobenzaprine (FLEXERIL) 5 MG tablet Take 5 mg by mouth at bedtime as needed.    . diclofenac Sodium (VOLTAREN) 1 % GEL Apply 4 g topically 4 (four) times daily.    . furosemide (LASIX) 20 MG tablet Take 20 mg by mouth daily as needed for fluid.     Marland Kitchen letrozole (FEMARA) 2.5 MG tablet TAKE 1 TABLET BY MOUTH EVERY DAY 90 tablet 3  . lidocaine (XYLOCAINE) 5 % ointment Apply 1 application topically as needed. 35.44 g 0  . pantoprazole (PROTONIX) 40 MG tablet Take 40 mg by mouth as needed.     . niacin (NIASPAN) 500 MG CR tablet Take 1 mg by mouth at bedtime.      No current facility-administered medications for this visit.    OBJECTIVE: There were no vitals filed for this visit.   There is no height or weight on file to calculate BMI.    ECOG FS:0  - Asymptomatic  General: Well-developed, well-nourished, no acute distress. HEENT: Normocephalic. Neuro: Alert, answering all questions appropriately. Cranial nerves grossly intact. Psych: Normal affect.   LAB RESULTS:  Lab Results  Component Value Date   NA 139 10/17/2016   K 4.4 10/17/2016   CL 105 10/17/2016   CO2 27 10/17/2016   GLUCOSE 110 (H) 10/17/2016   BUN 28 (H) 10/17/2016   CREATININE 0.79 10/17/2016   CALCIUM 8.9 10/17/2016   PROT 6.3 (L) 10/17/2016   ALBUMIN 3.7 10/17/2016   AST 14 (L) 10/17/2016   ALT 13 (L) 10/17/2016   ALKPHOS 64 10/17/2016   BILITOT 0.5 10/17/2016   GFRNONAA >60 10/17/2016   GFRAA >60 10/17/2016    Lab Results  Component Value Date   WBC 3.4 (L) 11/09/2016   NEUTROABS 3.1 09/05/2016   HGB 12.1 11/09/2016   HCT 35.7 11/09/2016   MCV 89.3 11/09/2016   PLT 173 11/09/2016  STUDIES: DG Bone Density  Result Date: 01/14/2020 EXAM: DUAL X-RAY ABSORPTIOMETRY (DXA) FOR BONE MINERAL DENSITY IMPRESSION: Your patient Courtney Herrera completed a BMD test on 01/13/2020 using the [employer] (software version: 14.10) manufactured by [employer]. The following summarizes the results of our evaluation. Technologist: SCE PATIENT BIOGRAPHICAL: Name: Courtney Herrera, Courtney Herrera Patient ID: [id number] Birth Date: 1941-02-05 Height: 65.5 in. Gender: Female Exam Date: 01/13/2020 Weight: 198.8 lbs. Indications: Advanced Age, Breast CA, Caucasian, High Risk Meds, History of Breast Cancer, History of Fracture (Adult), Hysterectomy, Oophorectomy Bilateral, Osteoarthritis, Osteopenia, Postmenopausal, Previous Chemo and Radiation Fractures: Right humerus Treatments: Calcium, femara, Protonix DENSITOMETRY RESULTS: Site      Region      Measured Date Measured Age WHO Classification Young Adult T-score BMD         %Change vs. Previous Significant Change (*) AP Spine L1-L4 01/13/2020 78.7 Osteopenia -2.0 0.944 g/cm2 -1.5% - AP Spine L1-L4 01/10/2019 77.7 Osteopenia  -1.9 0.958 g/cm2 6.1% Yes AP Spine L1-L4 01/08/2018 76.7 Osteopenia -2.4 0.903 g/cm2 -2.0% - AP Spine L1-L4 11/30/2016 75.6 Osteopenia -2.2 0.921 g/cm2 - - DualFemur Total Right 01/13/2020 78.7 Osteopenia -1.1 0.867 g/cm2 0.2% - DualFemur Total Right 01/10/2019 77.7 Osteopenia -1.1 0.865 g/cm2 1.4% - DualFemur Total Right 01/08/2018 76.7 Osteopenia -1.2 0.853 g/cm2 -3.1% - DualFemur Total Right 11/30/2016 75.6 Normal -1.0 0.880 g/cm2 - - DualFemur Total Mean 01/13/2020 78.7 Normal -1.0 0.879 g/cm2 -0.3% - DualFemur Total Mean 01/10/2019 77.7 Normal -1.0 0.882 g/cm2 1.5% - DualFemur Total Mean 01/08/2018 76.7 Osteopenia -1.1 0.869 g/cm2 -2.7% - DualFemur Total Mean 11/30/2016 75.6 Normal -0.9 0.893 g/cm2 - - ASSESSMENT: The BMD measured at AP Spine L1-L4 is 0.944 g/cm2 with a T-score of -2.0. This patient is considered osteopenic according to [place] [facility]) criteria. The scan quality is good. Compared with prior study, there has been no significant change in the spine. Compared with prior study, there has been no significant change in the total hip. World Pharmacologist [facility] criteria for post-menopausal, Caucasian Women: Normal:                   T-score at or above -1 SD Osteopenia/low bone mass: T-score between -1 and -2.5 SD Osteoporosis:             T-score at or below -2.5 SD RECOMMENDATIONS: 1. All patients should optimize calcium and vitamin D intake. 2. Consider FDA-approved medical therapies in postmenopausal women and men aged 47 years and older, based on the following: a. A hip or vertebral(clinical or morphometric) fracture b. T-score < -2.5 at the femoral neck or spine after appropriate evaluation to exclude secondary causes c. Low bone mass (T-score between -1.0 and -2.5 at the femoral neck or spine) and a 10-year probability of a hip fracture > 3% or a 10-year probability of a major osteoporosis-related fracture > 20% based on the [place]-adapted WHO algorithm 3. Clinician judgment  and/or patient preferences may indicate treatment for people with 10-year fracture probabilities above or below these levels FOLLOW-UP: People with diagnosed cases of osteoporosis or at high risk for fracture should have regular bone mineral density tests. For patients eligible for Medicare, routine testing is allowed once every 2 years. The testing frequency can be increased to one year for patients who have rapidly progressing disease, those who are receiving or discontinuing medical therapy to restore bone mass, or have additional risk factors. I have reviewed this report, and agree with the above findings. [facility] Dear Dr Grayland Ormond, Your patient Courtney Herrera completed a FRAX assessment on 01/13/2020 using the [employer] (analysis version: 14.10) manufactured by [employer]. The following summarizes the results of our evaluation. PATIENT BIOGRAPHICAL: Name: Courtney Herrera, Courtney Herrera Patient ID: [id number] Birth Date: 1940/05/16 Height:    65.5 in. Gender:     Female    [age]       Weight:    198.8 lbs. Ethnicity:  White                            Exam Date: 01/13/2020 FRAX* RESULTS:  (version: 3.5) 10-year Probability of Fracture1 Major Osteoporotic Fracture2 Hip Fracture 15.9% 2.6% Population: [place] (Caucasian) Risk Factors: History of Fracture (Adult) Based on Femur (Right) Neck BMD 1 -The 10-year probability of fracture may be lower than reported if the patient has received treatment. 2 -Major Osteoporotic Fracture: Clinical Spine, Forearm, Hip or Shoulder *FRAX is a Materials engineer of the [employer] for Metabolic Bone Disease, a [place] (WHO) [facility]. ASSESSMENT: The probability of a major osteoporotic fracture is 15.9% within the next ten years. The probability of a hip fracture is 2.6% within the next ten years. . Electronically Signed   By: Lowella Grip III M.D.   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 High risk Oncotype with recurrence score of 44.   PLAN:    1. 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 Her 10 year risk of distant recurrence with tamoxifen alone was approximately 30%, therefore adjuvant chemotherapy using Taxotere and Cytoxan was recommended. Patient completed treatment on Sep 05, 2016.  She also completed adjuvant XRT.  Continue letrozole for a minimum of 5 years through August 2023.  Although given her high risk disease, will likely recommend an additional 2 to 5 years of treatment.  Her most recent mammogram on May 01, 2019 was reported as BI-RADS 3.  Repeat right breast mammogram on October 30, 2019 was done reported as BI-RADS 2.  She will require bilateral diagnostic mammogram in January 2022.  Patient will have video assisted telemedicine visit in 6 months for routine evaluation.   2. Osteopenia: Patient's most recent bone mineral density on January 13, 2020 reported T score of -2.0 which is essentially unchanged from 1 year prior.  Patient has now discontinued Fosamax, but has been instructed to continue calcium and vitamin D supplementation.  Repeat in September 2022.    I provided 20 minutes of face-to-face video visit time during this encounter which included chart review, counseling, and coordination of care as documented above.   Patient expressed understanding and was in agreement with this plan. She also understands that She can call clinic at any time with any questions, concerns, or complaints.   Cancer Staging Primary cancer of upper outer quadrant of right female breast ([place]) HER 2 NEGATIVE  (FISH) Staging form: Breast, AJCC 8th Edition - Pathologic stage  from 06/12/2016: Stage IA (pT1c, pN0, cM0, G2, ER: Positive, PR: Negative, HER2: Negative) - Signed by Lloyd Huger, MD on 06/12/2016   Lloyd Huger, MD  01/15/2020 4:28 PM

## 2020-01-13 ENCOUNTER — Other Ambulatory Visit: Payer: Self-pay

## 2020-01-13 ENCOUNTER — Ambulatory Visit
Admission: RE | Admit: 2020-01-13 | Discharge: 2020-01-13 | Disposition: A | Discharge status: Home / Self Care | Payer: Medicare HMO | Source: Ambulatory Visit | Attending: Oncology | Admitting: Oncology

## 2020-01-13 DIAGNOSIS — M8580 Other specified disorders of bone density and structure, unspecified site: Secondary | ICD-10-CM | POA: Diagnosis not present

## 2020-01-13 DIAGNOSIS — Z13820 Encounter for screening for osteoporosis: Secondary | ICD-10-CM | POA: Insufficient documentation

## 2020-01-13 DIAGNOSIS — Z780 Asymptomatic menopausal state: Secondary | ICD-10-CM | POA: Diagnosis not present

## 2020-01-13 DIAGNOSIS — C50411 Malignant neoplasm of upper-outer quadrant of right female breast: Secondary | ICD-10-CM | POA: Insufficient documentation

## 2020-01-14 ENCOUNTER — Encounter: Payer: Self-pay | Admitting: Oncology

## 2020-01-14 NOTE — Progress Notes (Signed)
Patient called for pre assessment. She denies concerns at this time other than arthritis pain and trouble sleeping. She states she was recently seen regarding arthritis. She denies other concerns at this time.

## 2020-01-15 ENCOUNTER — Inpatient Hospital Stay: Payer: Medicare HMO | Attending: Oncology | Admitting: Oncology

## 2020-01-15 DIAGNOSIS — C50411 Malignant neoplasm of upper-outer quadrant of right female breast: Secondary | ICD-10-CM | POA: Diagnosis not present

## 2020-03-30 ENCOUNTER — Other Ambulatory Visit: Payer: Self-pay

## 2020-03-30 DIAGNOSIS — Z170 Estrogen receptor positive status [ER+]: Secondary | ICD-10-CM

## 2020-05-04 ENCOUNTER — Ambulatory Visit
Admission: RE | Admit: 2020-05-04 | Discharge: 2020-05-04 | Disposition: A | Discharge status: Home / Self Care | Payer: Medicare HMO | Source: Ambulatory Visit | Attending: Surgery | Admitting: Surgery

## 2020-05-04 ENCOUNTER — Other Ambulatory Visit: Payer: Self-pay

## 2020-05-04 DIAGNOSIS — C50911 Malignant neoplasm of unspecified site of right female breast: Secondary | ICD-10-CM

## 2020-05-04 DIAGNOSIS — Z1231 Encounter for screening mammogram for malignant neoplasm of breast: Secondary | ICD-10-CM | POA: Insufficient documentation

## 2020-05-04 DIAGNOSIS — Z853 Personal history of malignant neoplasm of breast: Secondary | ICD-10-CM | POA: Insufficient documentation

## 2020-05-04 DIAGNOSIS — Z170 Estrogen receptor positive status [ER+]: Secondary | ICD-10-CM | POA: Insufficient documentation

## 2020-05-05 ENCOUNTER — Telehealth: Payer: Self-pay

## 2020-05-05 NOTE — Telephone Encounter (Signed)
Patient notified of recent mammogram results per Dr.Pabon. Patient wanted to inform Dr.Pabon she was not happy with her recent mammogram due to the roughness of the nurse assisting with her mammogram being rough and causing the thin skin layer started to bleed and she just wanted to make him aware of it, and hopes that it has cleared up by the time she comes in for her scheduled follow up appointment Monday at 10 am, that she would like for him to take a look at it. Patient verbalized understanding.

## 2020-05-05 NOTE — Telephone Encounter (Signed)
-----   Message from Jules Husbands, MD sent at 05/05/2020  9:59 AM EST ----- Please let her know mammo looked good. F/u w me  ----- Message ----- From: Interface, Rad Results In Sent: 05/04/2020   1:42 PM EST To: Jules Husbands, MD

## 2020-05-10 ENCOUNTER — Ambulatory Visit (INDEPENDENT_AMBULATORY_CARE_PROVIDER_SITE_OTHER): Payer: Medicare HMO | Admitting: Surgery

## 2020-05-10 ENCOUNTER — Other Ambulatory Visit: Payer: Self-pay

## 2020-05-10 DIAGNOSIS — C50911 Malignant neoplasm of unspecified site of right female breast: Secondary | ICD-10-CM | POA: Diagnosis not present

## 2020-05-10 DIAGNOSIS — Z170 Estrogen receptor positive status [ER+]: Secondary | ICD-10-CM

## 2020-05-10 NOTE — Patient Instructions (Addendum)
We will contact you to schedule your mammogram for January 2022. If you have not heard from our office by the end of December 2022 please call our office. You may try Lidocaine cream over the counter.     Breast Self-Awareness Breast self-awareness is knowing how your breasts look and feel. Doing breast self-awareness is important. It allows you to catch a breast problem early while it is still small and can be treated. All women should do breast self-awareness, including women who have had breast implants. Tell your doctor if you notice a change in your breasts. What you need:  A mirror.  A well-lit room. How to do a breast self-exam A breast self-exam is one way to learn what is normal for your breasts and to check for changes. To do a breast self-exam: Look for changes 1. Take off all the clothes above your waist. 2. Stand in front of a mirror in a room with good lighting. 3. Put your hands on your hips. 4. Push your hands down. 5. Look at your breasts and nipples in the mirror to see if one breast or nipple looks different from the other. Check to see if: ? The shape of one breast is different. ? The size of one breast is different. ? There are wrinkles, dips, and bumps in one breast and not the other. 6. Look at each breast for changes in the skin, such as: ? Redness. ? Scaly areas. 7. Look for changes in your nipples, such as: ? Liquid around the nipples. ? Bleeding. ? Dimpling. ? Redness. ? A change in where the nipples are.   Feel for changes 1. Lie on your back on the floor. 2. Feel each breast. To do this, follow these steps: ? Pick a breast to feel. ? Put the arm closest to that breast above your head. ? Use your other arm to feel the nipple area of your breast. Feel the area with the pads of your three middle fingers by making small circles with your fingers. For the first circle, press lightly. For the second circle, press harder. For the third circle, press even  harder. ? Keep making circles with your fingers at the different pressures as you move down your breast. Stop when you feel your ribs. ? Move your fingers a little toward the center of your body. ? Start making circles with your fingers again, this time going up until you reach your collarbone. ? Keep making up-and-down circles until you reach your armpit. Remember to keep using the three pressures. ? Feel the other breast in the same way. 3. Sit or stand in the tub or shower. 4. With soapy water on your skin, feel each breast the same way you did in step 2 when you were lying on the floor.   Write down what you find Writing down what you find can help you remember what to tell your doctor. Write down:  What is normal for each breast.  Any changes you find in each breast, including: ? The kind of changes you find. ? Whether you have pain. ? Size and location of any lumps.  When you last had your menstrual period. General tips  Check your breasts every month.  If you are breastfeeding, the best time to check your breasts is after you feed your baby or after you use a breast pump.  If you get menstrual periods, the best time to check your breasts is 5-7 days after your menstrual period  is over.  With time, you will become comfortable with the self-exam, and you will begin to know if there are changes in your breasts. Contact a doctor if you:  See a change in the shape or size of your breasts or nipples.  See a change in the skin of your breast or nipples, such as red or scaly skin.  Have fluid coming from your nipples that is not normal.  Find a lump or thick area that was not there before.  Have pain in your breasts.  Have any concerns about your breast health. Summary  Breast self-awareness includes looking for changes in your breasts, as well as feeling for changes within your breasts.  Breast self-awareness should be done in front of a mirror in a well-lit room.  You  should check your breasts every month. If you get menstrual periods, the best time to check your breasts is 5-7 days after your menstrual period is over.  Let your doctor know of any changes you see in your breasts, including changes in size, changes on the skin, pain or tenderness, or fluid from your nipples that is not normal. This information is not intended to replace advice given to you by your health care provider. Make sure you discuss any questions you have with your health care provider. Document Revised: 11/20/2017 Document Reviewed: 11/20/2017 Elsevier Patient Education  [place].

## 2020-05-12 NOTE — Progress Notes (Signed)
Outpatient Surgical Follow Up  05/12/2020  Courtney Herrera is an 80 y.o. female.   Chief Complaint  Patient presents with  . Follow-up    Bil 6 mo dx mammogram    HPI: Ms. Governale is a 80 year old female with a prior history of breast cancer status post lumpectomy radiation therapy and chemotherapy on 2018. She has been doing well. She denies any fevers, no chills no weight loss. No breast masses or nipple discharge. She did have a mammogram that I have personally reviewed showing some evidence of benign calcifications on the right side but no other new findings. Calcifications are stable. No discrete masses that warrant biopsies.  Please note that this calcifications have remained stable without any major changes. SHe does have some discomfort around the incision  on the right side. She has hypersensitive breast and did not have a good experience with last mammogram. She endorses more breat pain that is moderate and some skin breakdown after mammo  Past Medical History:  Diagnosis Date  . Arthritis   . Barrett's esophagus   . Breast cancer ([place]) 05/11/2016   7 mm ER 90%; PR neg, Her 2 neu not overexpressed, Mammoprint: High risk. Whole breast radiation.  . Cancer [facility]) 2012   uterine- radiation  . Endometrial cancer ([place]) 2012  . GERD (gastroesophageal reflux disease)   . Hyperlipidemia   . Hypertension   . Osteoporosis    post-menopausal  . Personal history of chemotherapy   . Personal history of radiation therapy 2018    Past Surgical History:  Procedure Laterality Date  . ABDOMINAL HYSTERECTOMY  2012  . BREAST BIOPSY Left    neg  . BREAST BIOPSY Right 05/11/2016   INVASIVE MAMMARY CARCINOMA  . BREAST CYST ASPIRATION Left    neg  . BREAST EXCISIONAL BIOPSY Right 06/01/2016   + lumpectomy  . BREAST LUMPECTOMY Right 2018   Invasive mammary, neg margins  . BREAST LUMPECTOMY WITH SENTINEL LYMPH NODE BIOPSY Right 06/01/2016   Procedure: BREAST LUMPECTOMY WITH SENTINEL LYMPH  NODE BX;  Surgeon: Robert Bellow, MD;  Location: [facility] ORS;  Service: General;  Laterality: Right;  . COLONOSCOPY  2015  . COLONOSCOPY WITH PROPOFOL N/A 06/09/2019   Procedure: COLONOSCOPY WITH PROPOFOL;  Surgeon: Toledo, Benay Pike, MD;  Location: [facility] ENDOSCOPY;  Service: Gastroenterology;  Laterality: N/A;    Family History  Problem Relation Age of Onset  . Breast cancer Sister 23  . Cancer Brother        brain  . Cancer Sister   . Breast cancer Sister 72  . Non-Hodgkin's lymphoma Brother     Social History:  reports that she has never smoked. She has never used smokeless tobacco. She reports that she does not drink alcohol and does not use drugs.  Allergies:  Allergies  Allergen Reactions  . Sulphadimidine [Sulfamethazine] Swelling  . Petrolatum-Zinc Oxide Swelling    Medications reviewed.    ROS Full ROS performed and is otherwise negative other than what is stated in HPI   There were no vitals taken for this visit.  Physical Exam Physical Exam Vitalsand nursing notereviewed. Exam conducted with a chaperone present.  Constitutional:  General: She is not in acute distress. Appearance: Normal appearance. She isnormal weight.  Eyes:  General: No scleral icterus.  Right eye: No discharge.  Left eye: No discharge.  Extraocular Movements: Extraocular movements intact.  Conjunctiva/sclera: Conjunctivae normal.  Cardiovascular:  Rate and Rhythm: Normal rateand regular rhythm.  Heart sounds: No  murmur.  Pulmonary:  Effort: Pulmonary effort is normal. Norespiratory distress.  Breath sounds: Normal breath sounds. Nostridor.  Comments: BREAST: Evidence of right lumpectomy scar with sentinel lymph node biopsy scar. No evidence of new lesions. Changes consistent with radiation the right breast. No evidence of concerning lesions on either breast. No evidence of lymphadenopathy. There is also evidence of mild tenderness  to palpation of both the axilla and the breast scars.  Right inframammary fold there is skin breakdown ( superficial linear diminutive ulcerations) , no infection Abdominal:  General: Abdomen is flat. There is nodistension.  Palpations: Abdomen is soft. There is nomass.  Tenderness: There is no rebound.  Hernia: No herniais present.  Musculoskeletal:  Cervical back: Normal range of motionand neck supple.  Skin: General: Skin is warmand dry.  Capillary Refill: Capillary refill takes less than 2 seconds.  Neurological:  General: No focal deficitpresent.  Mental Status: She is alertand oriented to person, place, and time.  Psychiatric:  Mood and Affect: Moodnormal.  Behavior: Behaviornormal.  Thought Content: Thought contentnormal.  Judgment: Judgment nml  Assessment/Plan:  80 year old female with a history of right breast cancer status post lumpectomy and radiation therapy.  stable calcification w/o new concerning lesions  We will see her back in 6 months with a mammogram.  There is no need for biopsies at this time or any surgical interventions.   Greater than 50% of the 25 minutes  visit was spent in counseling/coordination of care   Caroleen Hamman, MD [place] [facility]geon

## 2020-07-12 ENCOUNTER — Other Ambulatory Visit: Payer: Self-pay

## 2020-07-12 ENCOUNTER — Inpatient Hospital Stay: Payer: Medicare HMO | Attending: Oncology | Admitting: Oncology

## 2020-07-12 ENCOUNTER — Encounter: Payer: Self-pay | Admitting: Oncology

## 2020-07-12 VITALS — BP 164/96 | HR 72 | Temp 97.8°F | Resp 20 | Wt 201.4 lb

## 2020-07-12 DIAGNOSIS — Z923 Personal history of irradiation: Secondary | ICD-10-CM | POA: Diagnosis not present

## 2020-07-12 DIAGNOSIS — Z79811 Long term (current) use of aromatase inhibitors: Secondary | ICD-10-CM | POA: Diagnosis not present

## 2020-07-12 DIAGNOSIS — C50411 Malignant neoplasm of upper-outer quadrant of right female breast: Secondary | ICD-10-CM | POA: Insufficient documentation

## 2020-07-12 DIAGNOSIS — Z170 Estrogen receptor positive status [ER+]: Secondary | ICD-10-CM | POA: Insufficient documentation

## 2020-07-12 DIAGNOSIS — M8580 Other specified disorders of bone density and structure, unspecified site: Secondary | ICD-10-CM | POA: Diagnosis not present

## 2020-07-12 NOTE — Progress Notes (Signed)
Patient denies any concerns today.  

## 2020-07-12 NOTE — Progress Notes (Signed)
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 High risk Oncotype with recurrence score of 44.  INTERVAL HISTORY: Courtney Herrera is a 80 year old female who presents for 34-monthfollow-up.  She was last seen in clinic on 01/15/2020.  Today, she reports feeling well since her last visit.  She denies any new lumps or bumps.  She did have a diagnostic mammogram on 05/04/2020 which was BI-RADS Category 2 or benign.  Dr. PPerrin Malteseappears to be following her fairly closely. She continues to tolerate her letrozole well.  She is taking calcium and vitamin D.  She is not taking Fosamax at this time.  She has a good appetite and denies any unintentional weight loss.  Reports weight gain.  [facility] has recently been [facility].   REVIEW OF SYSTEMS:   Review of Systems  Constitutional: Negative.  Negative for fever, malaise/fatigue and weight loss.  Respiratory: Negative.  Negative for cough and shortness of breath.   Cardiovascular: Negative.  Negative for chest pain and leg swelling.  Gastrointestinal: Negative.  Negative for abdominal pain.  Genitourinary: Negative.  Negative for dysuria.  Musculoskeletal: Negative.  Negative for back pain and joint pain.  Skin: Negative.  Negative for rash.  Neurological: Negative.  Negative for focal weakness, weakness and headaches.  Psychiatric/Behavioral: Negative.  The patient is not nervous/anxious  and does not have insomnia.     As per HPI. Otherwise, a complete review of systems is negative.  PAST MEDICAL HISTORY: Past Medical History:  Diagnosis Date  . Arthritis   . Barrett's esophagus   . [place]   7 mm ER 90%; PR neg, Her 2 neu not overexpressed, Mammoprint: High risk. Whole breast radiation.  . Cancer ([facility] 2012   uterine- radiation  . [place]  . GERD (gastroesophageal reflux disease)   . Hyperlipidemia   . Hypertension   . Osteoporosis    post-menopausal  . Personal history of chemotherapy   . Personal history of radiation therapy 2018    PAST SURGICAL HISTORY: Past Surgical History:  Procedure Laterality Date  . ABDOMINAL HYSTERECTOMY  2012  . BREAST BIOPSY Left    neg  . BREAST BIOPSY Right 05/11/2016   INVASIVE MAMMARY CARCINOMA  . BREAST CYST ASPIRATION Left    neg  . BREAST EXCISIONAL BIOPSY Right 06/01/2016   + lumpectomy  . BREAST LUMPECTOMY Right 2018   Invasive mammary, neg margins  . BREAST LUMPECTOMY WITH SENTINEL LYMPH NODE BIOPSY Right 06/01/2016   Procedure: BREAST LUMPECTOMY WITH SENTINEL LYMPH NODE BX;  Surgeon: JRobert Bellow MD;  Location: [facility] ORS;  Service: General;  Laterality: Right;  . COLONOSCOPY  2015  . COLONOSCOPY WITH PROPOFOL N/A 06/09/2019   Procedure: COLONOSCOPY WITH PROPOFOL;  Surgeon: Toledo, TBenay Pike MD;  Location: [facility] ENDOSCOPY;  Service: Gastroenterology;  Laterality: N/A;    FAMILY HISTORY: Family History  Problem Relation Age of Onset  . Breast cancer Sister 617 . Cancer Brother  brain  . Cancer Sister   . Breast cancer Sister 62  . Non-Hodgkin's lymphoma Brother     ADVANCED DIRECTIVES (Y/N):  N  HEALTH MAINTENANCE: Social History   Tobacco Use  . Smoking status: Never Smoker  . Smokeless tobacco: Never Used  Vaping Use  . Vaping Use: Never used  Substance Use Topics  . Alcohol use: No    Alcohol/week: 0.0 standard drinks  . Drug use: No      Colonoscopy:  PAP:  Bone density:  Lipid panel:  Allergies  Allergen Reactions  . Sulphadimidine [Sulfamethazine] Swelling  . Petrolatum-Zinc Oxide Swelling    Current Outpatient Medications  Medication Sig Dispense Refill  . aspirin 81 MG chewable tablet Chew 81 mg by mouth at bedtime.     Marland Kitchen atorvastatin (LIPITOR) 80 MG tablet TAKE 1 TABLET (80 MG TOTAL) BY MOUTH ONCE DAILY.  1  . calcium carbonate (OSCAL) 1500 (600 Ca) MG TABS tablet Take 1,500 mg by mouth 2 (two) times daily with a meal.    . carvedilol (COREG) 6.25 MG tablet Take 6.25 mg by mouth 2 (two) times daily with a meal.    . cyclobenzaprine (FLEXERIL) 5 MG tablet Take 5 mg by mouth at bedtime as needed.    . diclofenac Sodium (VOLTAREN) 1 % GEL Apply 4 g topically 4 (four) times daily.    . furosemide (LASIX) 20 MG tablet Take 20 mg by mouth daily as needed for fluid.     Marland Kitchen letrozole (FEMARA) 2.5 MG tablet TAKE 1 TABLET BY MOUTH EVERY DAY 90 tablet 3  . niacin (NIASPAN) 500 MG CR tablet Take 1 mg by mouth at bedtime.      No current facility-administered medications for this visit.    OBJECTIVE: Vitals:   07/12/20 1413  BP: (!) 164/96  Pulse: 72  Resp: 20  Temp: 97.8 F (36.6 C)     Body mass index is 32.51 kg/m.    ECOG FS:0 - Asymptomatic Physical Exam Constitutional:      Appearance: Normal appearance. She is obese.  HENT:     Head: Normocephalic and atraumatic.  Eyes:     Pupils: Pupils are equal, round, and reactive to light.  Cardiovascular:     Rate and Rhythm: Normal rate and regular rhythm.     Heart sounds: Normal heart sounds. No murmur heard.   Pulmonary:     Effort: Pulmonary effort is normal.     Breath sounds: Normal breath sounds. No wheezing.  Abdominal:     General: Bowel sounds are normal. There is no distension.     Palpations: Abdomen is soft.     Tenderness: There is no abdominal tenderness.  Musculoskeletal:        General: Normal range of motion.     Cervical back:  Normal range of motion.  Skin:    General: Skin is warm and dry.     Findings: No rash.  Neurological:     Mental Status: She is alert and oriented to person, place, and time.  Psychiatric:        Judgment: Judgment normal.      LAB RESULTS:  Lab Results  Component Value Date   NA 139 10/17/2016   K 4.4 10/17/2016   CL 105 10/17/2016   CO2 27 10/17/2016   GLUCOSE 110 (H) 10/17/2016   BUN 28 (H) 10/17/2016   CREATININE 0.79 10/17/2016   CALCIUM 8.9 10/17/2016   PROT 6.3 (L) 10/17/2016   ALBUMIN  3.7 10/17/2016   AST 14 (L) 10/17/2016   ALT 13 (L) 10/17/2016   ALKPHOS 64 10/17/2016   BILITOT 0.5 10/17/2016   GFRNONAA >60 10/17/2016   GFRAA >60 10/17/2016    Lab Results  Component Value Date   WBC 3.4 (L) 11/09/2016   NEUTROABS 3.1 09/05/2016   HGB 12.1 11/09/2016   HCT 35.7 11/09/2016   MCV 89.3 11/09/2016   PLT 173 11/09/2016     STUDIES: No results found.  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 High risk Oncotype with recurrence score of 44.   PLAN:    1. 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 -She started letrozole in March 2018 and will complete 5 years of treatment in March 2023.  She likely would benefit from extended treatment given high risk disease with letrozole for at least 2 to 5 years. -Most recent mammogram from 05/04/2020 was read as category 2 benign. -She is followed by Dr. Perrin Maltese closely and surgery and he orders her mammograms.  2. Osteopenia:   -Initial bone density from 01/13/2020 was reported as a T score of -2.0 which is considered osteopenia. -This is unchanged from 1 year prior. -She is currently taking calcium and vitamin D. -She discontinued Fosamax -She will repeat in September  2022.  Disposition: -Repeat bone density in September 2022. -Repeat mammogram per Dr, Perrin Maltese.  -Continue letrozole.  Greater than 50% was spent in counseling and coordination of care with this patient including but not limited to discussion of the relevant topics above (See A&P) including, but not limited to diagnosis and management of acute and chronic medical conditions.    Patient expressed understanding and was in agreement with this plan. She also understands that She can call clinic at any time with any questions, concerns, or complaints.   Cancer Staging Primary cancer of upper outer quadrant of right female breast ([place]) HER 2 NEGATIVE  (FISH) Staging form: Breast, AJCC 8th Edition - Pathologic stage from 06/12/2016: Stage IA (pT1c, pN0, cM0, G2, ER: Positive, PR: Negative, HER2: Negative) - Signed by Lloyd Huger, MD on 06/12/2016 Neoadjuvant therapy: No Multigene prognostic tests performed: MammaPrint Histologic grading system: 3 grade system Laterality: Right   Jacquelin Hawking, NP   07/12/2020 2:37 PM

## 2020-12-24 ENCOUNTER — Other Ambulatory Visit: Payer: Self-pay

## 2020-12-24 MED ORDER — LETROZOLE 2.5 MG PO TABS: 2.5000 mg | ORAL_TABLET | Freq: Every day | ORAL | 2 refills | Status: AC

## 2021-01-06 ENCOUNTER — Ambulatory Visit: Payer: Medicare HMO | Admitting: Radiation Oncology

## 2021-01-12 ENCOUNTER — Telehealth: Payer: Self-pay | Admitting: Oncology

## 2021-01-12 NOTE — Telephone Encounter (Signed)
Patient left vm requesting to cancel all future appointments as she moved to [place] and has established care there.

## 2021-01-13 ENCOUNTER — Other Ambulatory Visit: Payer: Medicare HMO

## 2021-01-18 ENCOUNTER — Inpatient Hospital Stay: Payer: Medicare HMO | Admitting: Oncology

## 2021-04-07 ENCOUNTER — Other Ambulatory Visit: Payer: Self-pay

## 2021-04-07 DIAGNOSIS — Z1231 Encounter for screening mammogram for malignant neoplasm of breast: Secondary | ICD-10-CM

## 2021-04-08 ENCOUNTER — Other Ambulatory Visit: Payer: Self-pay | Admitting: Surgery

## 2021-04-08 DIAGNOSIS — Z1231 Encounter for screening mammogram for malignant neoplasm of breast: Secondary | ICD-10-CM

## 2021-04-14 ENCOUNTER — Encounter: Payer: Self-pay | Admitting: Oncology

## 2021-05-11 ENCOUNTER — Ambulatory Visit: Payer: Medicare HMO

## 2021-05-18 ENCOUNTER — Ambulatory Visit: Payer: Medicare HMO | Admitting: Surgery
